# Patient Record
Sex: Female | Born: 1950 | Race: White | Hispanic: No | Marital: Married | Smoking: Never smoker
Health system: Southern US, Academic
[De-identification: ages and names within clinical notes are randomized; demographics above are authoritative.]

## PROBLEM LIST (undated history)

## (undated) DIAGNOSIS — I10 Essential (primary) hypertension: Secondary | ICD-10-CM

## (undated) DIAGNOSIS — G47419 Narcolepsy without cataplexy: Secondary | ICD-10-CM

## (undated) DIAGNOSIS — K219 Gastro-esophageal reflux disease without esophagitis: Secondary | ICD-10-CM

## (undated) DIAGNOSIS — A6920 Lyme disease, unspecified: Secondary | ICD-10-CM

## (undated) DIAGNOSIS — G2581 Restless legs syndrome: Secondary | ICD-10-CM

## (undated) HISTORY — PX: HX BUNIONECTOMY: SHX129

## (undated) HISTORY — PX: HX APPENDECTOMY: SHX54

## (undated) HISTORY — PX: HX HYSTERECTOMY: SHX81

## (undated) HISTORY — PX: CATARACT EXTRACTION, BILATERAL: SHX1313

## (undated) HISTORY — PX: HX BREAST BIOPSY: SHX20

## (undated) HISTORY — PX: HX HIP REPLACEMENT: SHX124

## (undated) HISTORY — PX: CARDIAC CATHETERIZATION: SHX172

---

## 1992-02-19 ENCOUNTER — Other Ambulatory Visit (HOSPITAL_COMMUNITY): Payer: Self-pay

## 2007-11-21 IMAGING — MG MAMMO SCREEN W CAD
1 series · 5 of 5 positions shown · non-contrast
Comparison: 08/15/2004 and 03/04/2003.

Encarnacion, Hipolito

BILATERAL DIGITAL SCREENING MAMMOGRAM WITH COMPUTER-ASSISTED DIAGNOSIS:
HISTORY: Asymptomatic 57-year old with no family history of breast cancer in first degree relatives.  The patient had a previous benign biopsy of the right breast.

[Series 2: R CC · right · 5 of 5 slices shown]
[im 1/5]
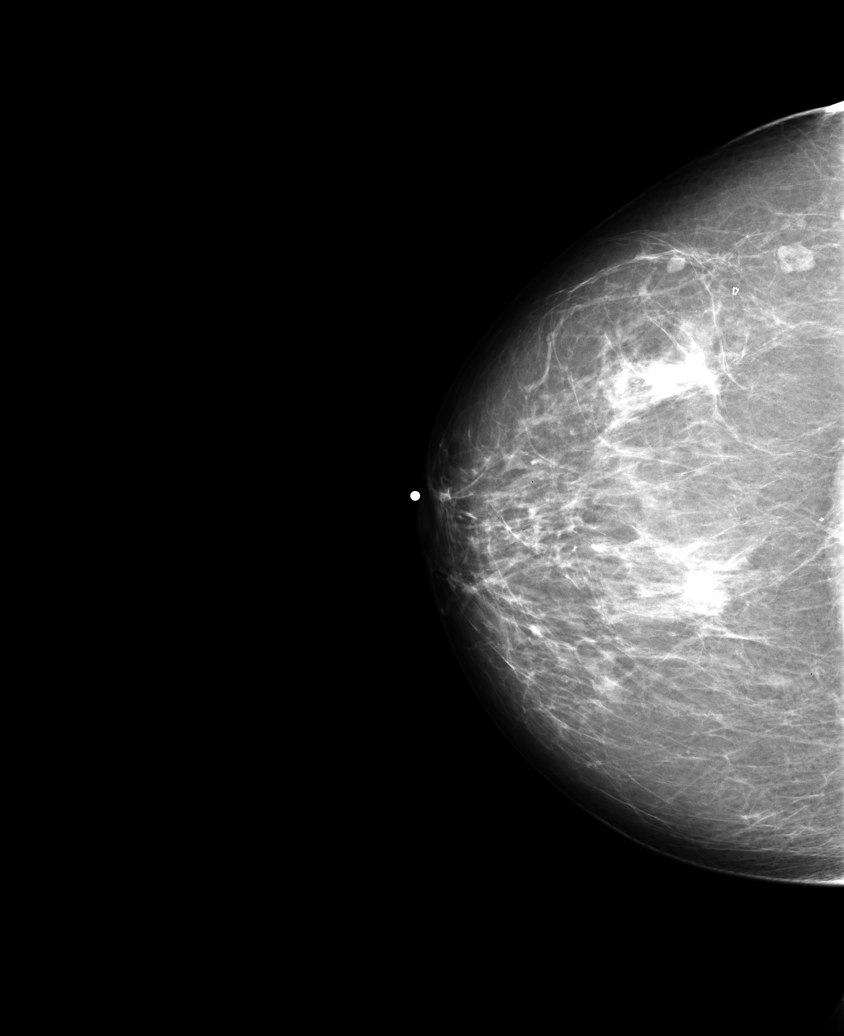
[im 2/5]
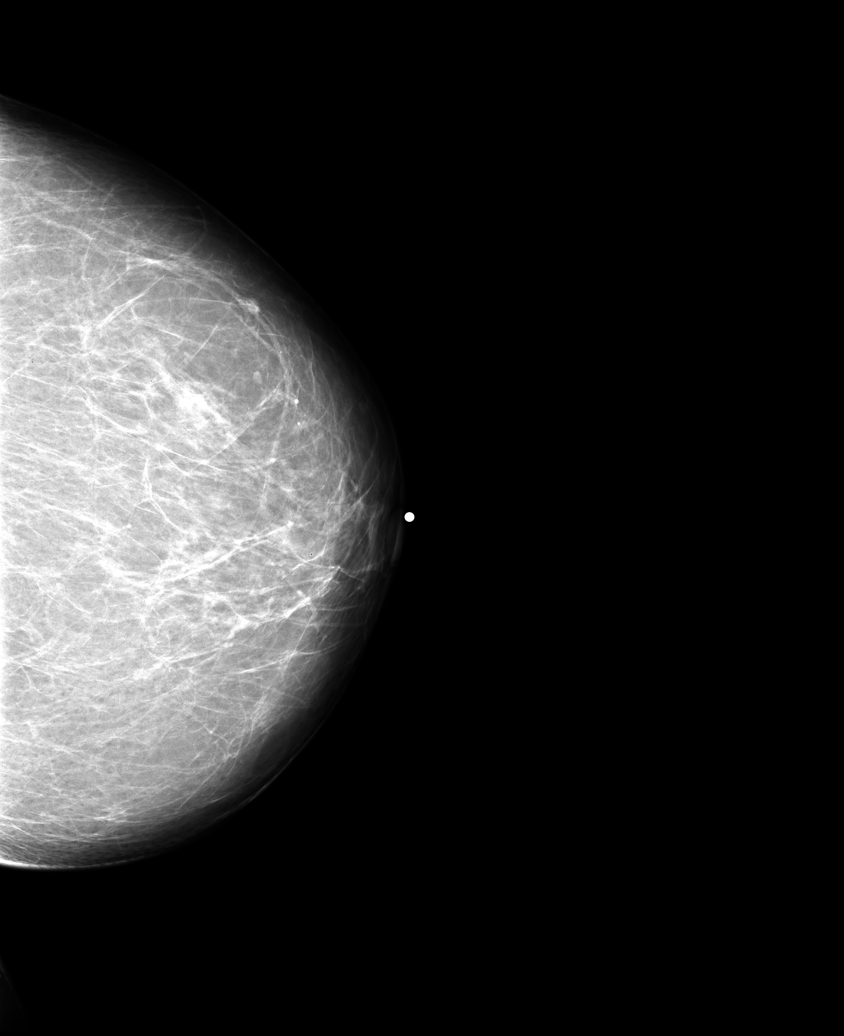
[im 3/5]
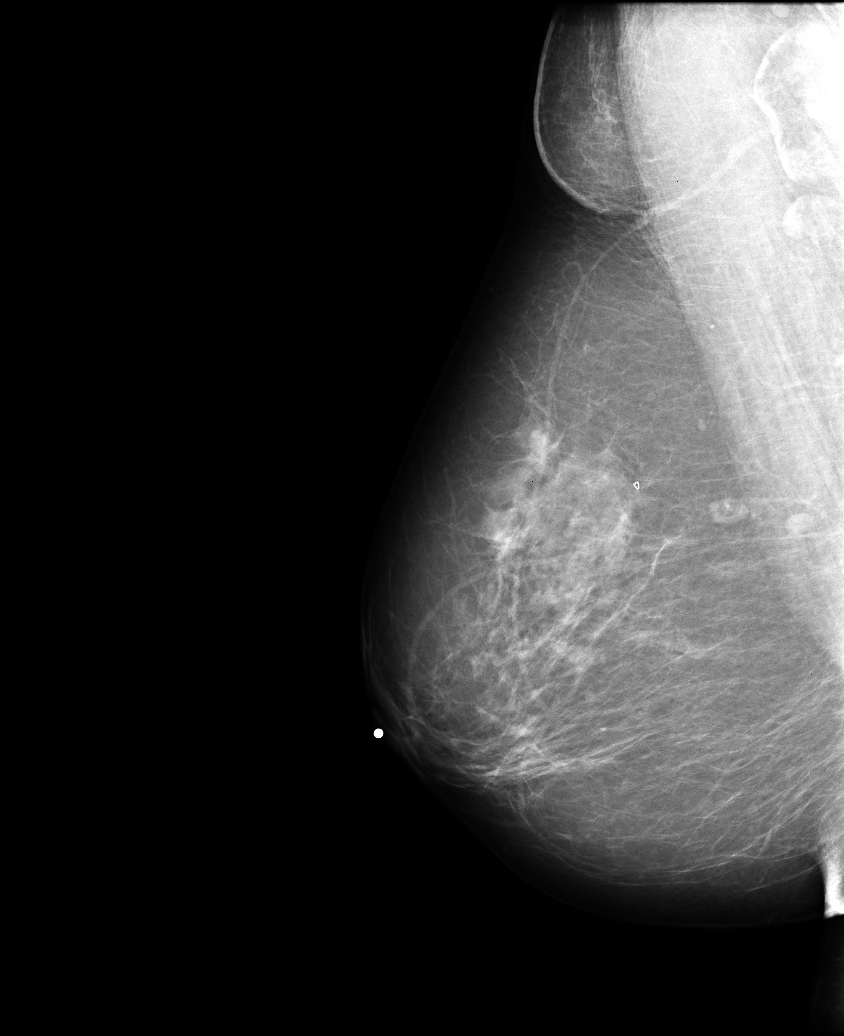
[im 4/5]
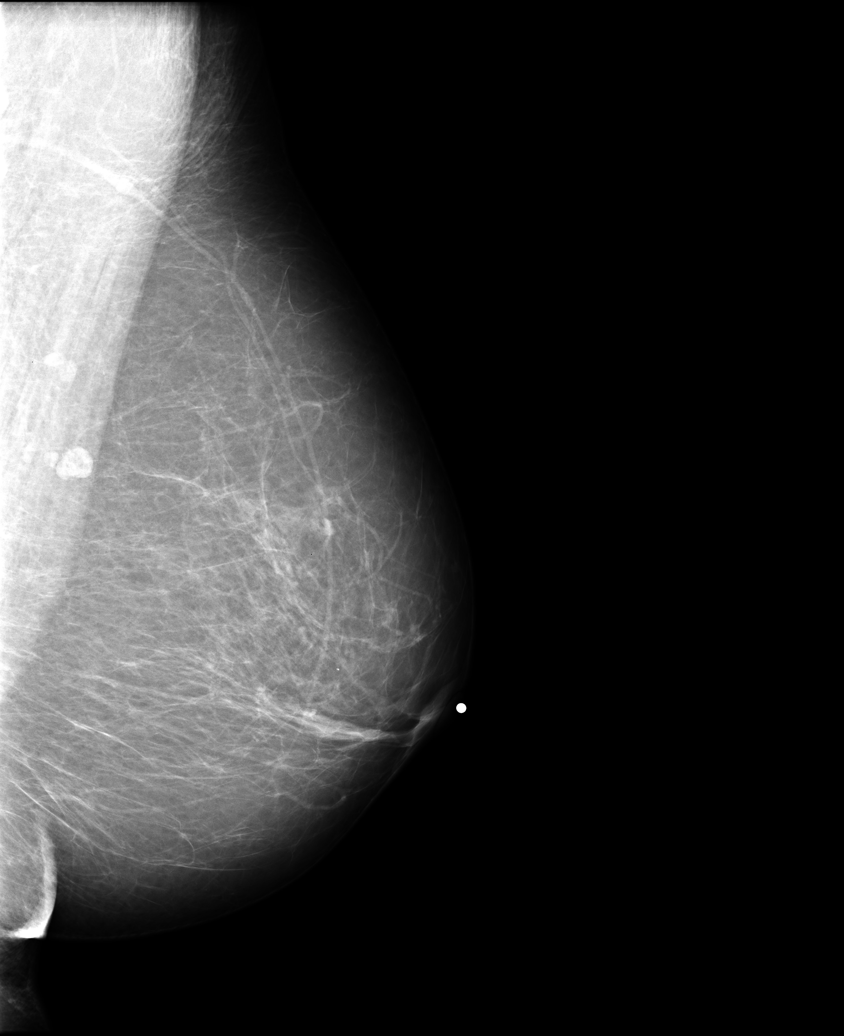
[im 5/5]
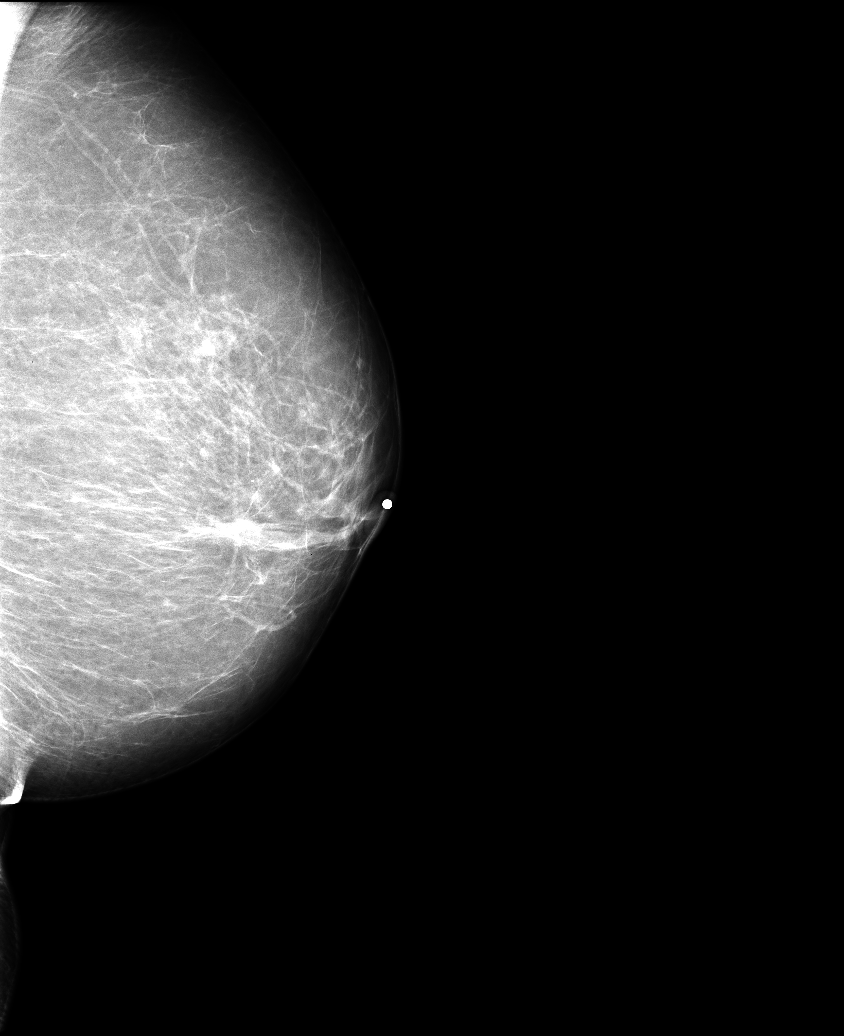

[5 of 5 positions shown; findings below may reference images not displayed]

FINDINGS: Breast tissues are predominately fatty.  Post-biopsy changes with a stereotactic biopsy clip in the upper lateral right breast are noted.  Small nodular densities in the posterior aspect of the right breast are also stable.  No calcific densities, skin changes or nipple changes are seen.

NOTE:

In compliance with Federal regulations, the results of this mammogram are being sent to the patient.
IMPRESSION: Stable mammographic findings including asymmetric breast tissues in the right breast and post-biopsy changes of the right breast and intramammary lymph nodes in the right breast.  Clinical followup and mammographic followup at 12 months are recommended.

BI-RADS 2:

BI-RADS 0
Need additional imaging evaluation
BI-RADS 1
Negative mammogram
BI-RADS 2
Benign finding
BI-RADS 3
Probably benign finding - short interval follow-up suggested
BI-RADS 4
Suspicious abnormality: biopsy should be considered
BI-RADS 5
Highly suggestive of malignancy; appropriate action should be taken
________________________________

## 2012-03-18 IMAGING — MG MAMMO BILAT DIAGNOSTIC W CAD
1 series · 5 of 5 positions shown · non-contrast
Comparison: 

Bosch, Kenia

Jumper, Nya
Exam:
Bilateral digital diagnostic mammogram with CAD and high resolution ultrasound of both breasts
HISTORY: 61 year old female complains of bilateral breast tenderness. Patient had previous benign biopsy of the right breast. There is no family history of breast cancer in first degree relatives.

[Series 2: R CC · right · 5 of 5 slices shown]
[im 1/5]
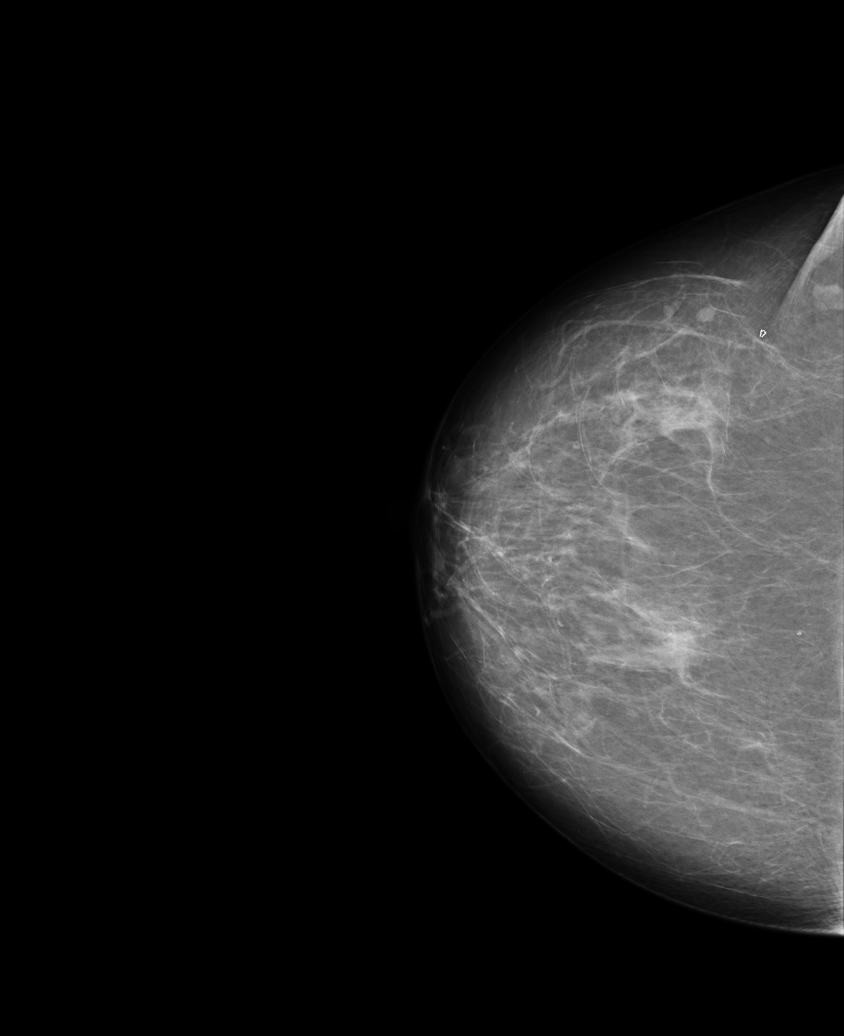
[im 2/5]
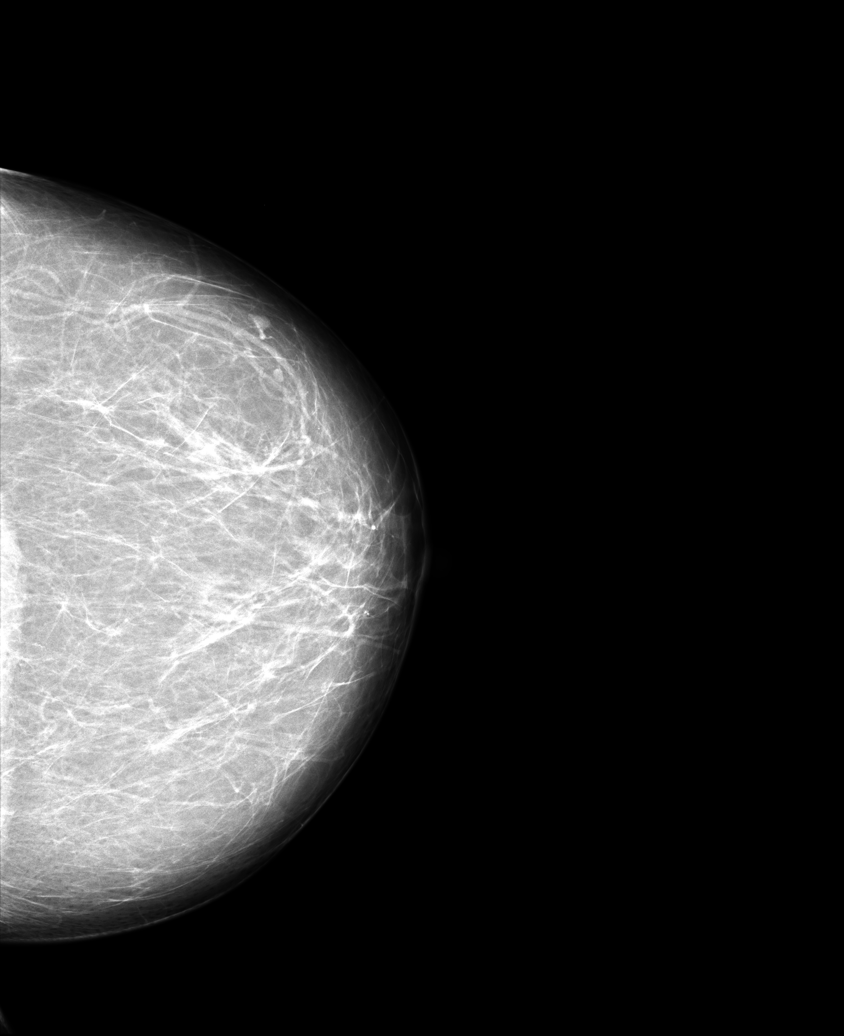
[im 3/5]
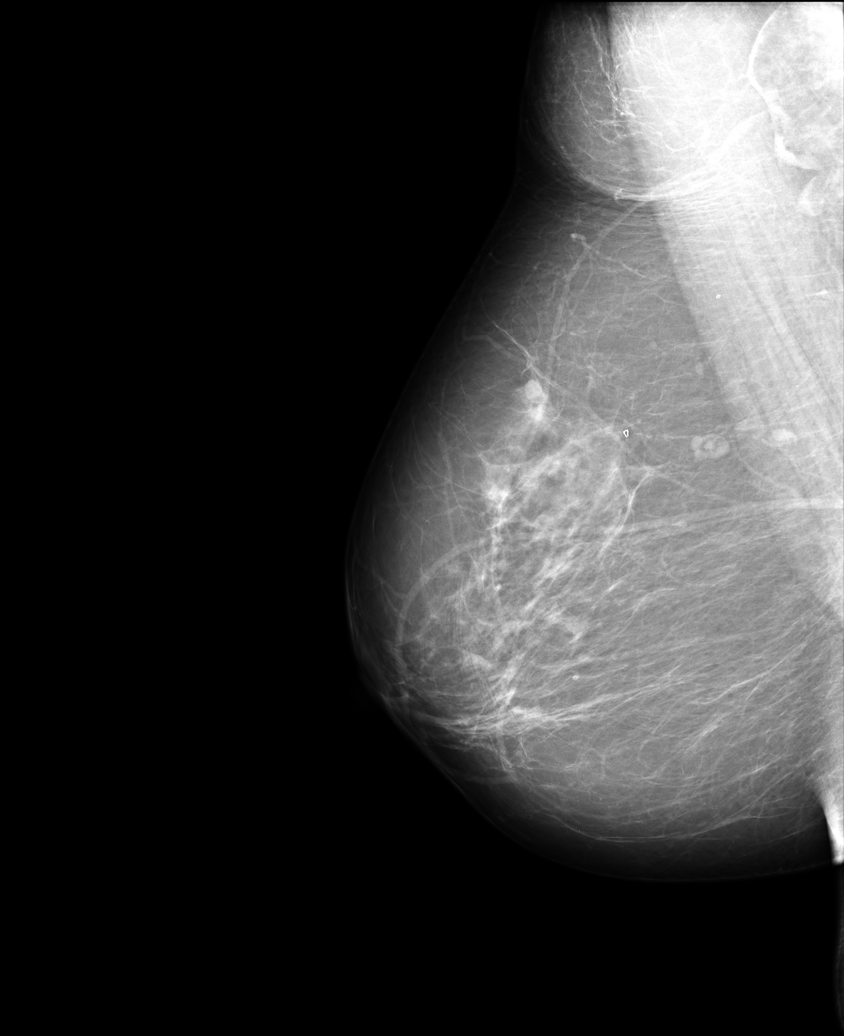
[im 4/5]
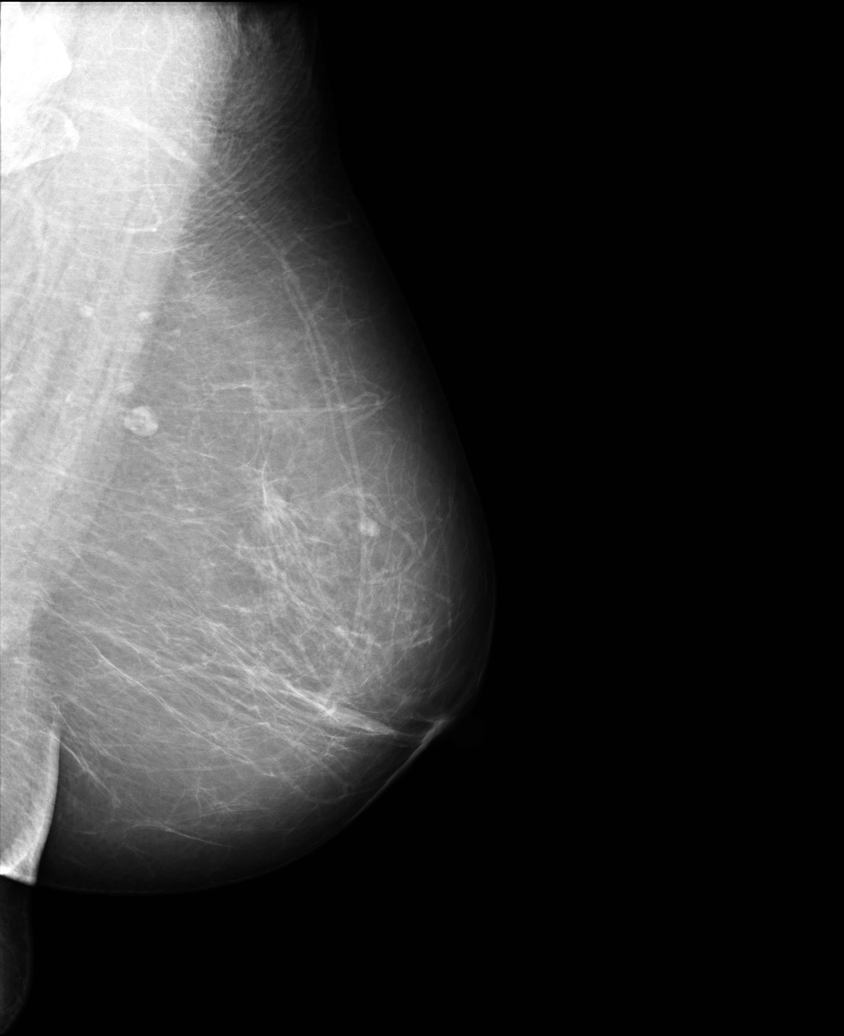
[im 5/5]
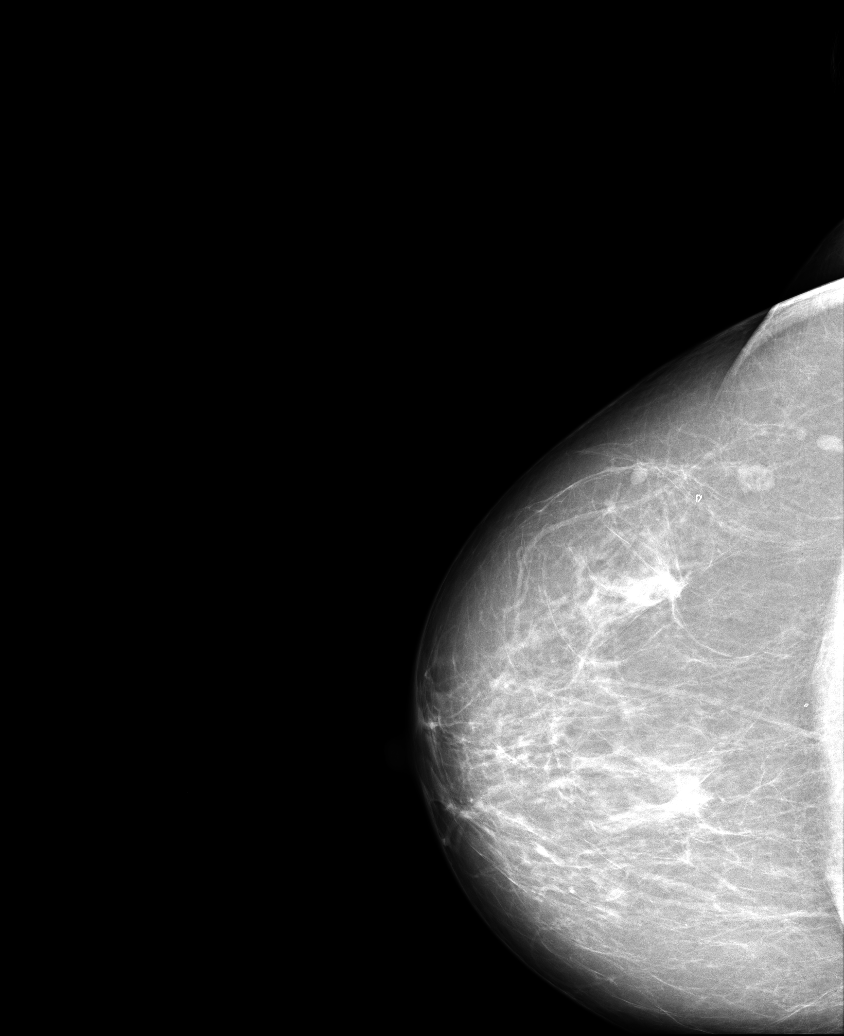

[5 of 5 positions shown; findings below may reference images not displayed]

FINDINGS: Breast tissues are mostly fatty. Biopsy changes of the lateral right breast are stable. Small benign appearing nodules of both breasts are also stable. No abnormal calcific density, skin changes or nipple changes are seen. 

High resolution ultrasound of both breasts reveals no cystic mass, solid mass or architectural change. Benign appearing intramammary lymph node is noted on the left side at 2 o’clock position, zone 3 with fatty hilum suggestive of benign intramammary node less than 1 cm in size.
IMPRESSION: Benign and stable findings on mammogram. 
Benign findings of both breasts on ultrasound including a benign appearing intramammary lymph node at 2 o’clock position of the left breast. 
RECOMMENDATION: Repeat bilateral breast ultrasound is recommended in six months along with clinical follow up in six months. 
Final Assessment Code:
Bi-Rads 3
BI-RADS 0
Need additional imaging evaluation
BI-RADS 1
Negative mammogram
BI-RADS 2
Benign finding
BI-RADS 3
Probably benign finding: short-interval follow-up suggested
BI-RADS 4
Suspicious abnormality:  biopsy should be considered
BI-RADS 5
Highly suggestive of malignancy; appropriate action should be taken

NOTE:
In compliance with Federal regulations, the results of this mammogram are being sent to the patient.

________________________________
Izko Ciise., signed this document electronically

## 2012-06-15 IMAGING — CT HEAD^HEAD_SPIRAL_CONTRAST_SAFECT (ADULT)
1 of 2 series · 14 of 30 positions shown, 18 images · non-contrast
Comparison: None available.

Ausfluge, Belo Selma
CT Head w/contrast BANKS

Abd L Raheem, Ahmed Ramazan Hussain
bandhu Tiger, Dleke
Exam:
CT Head with and without contrast, low dose Safe CT protocol
HISTORY: 61 year old female with history of severe headache, visual disturbance and nausea.
TECHNIQUE: Axial images were obtained before and after intravenous administration of 85 cc’s of Optiray 300. Images were reviewed in multiple windows. Total radiation dose length product equals 6811 mGy cm.

[Series 905: head w (safect) · axial · 0.49mm/px · z∈[-37,+98]mm · 14 of 53 slices shown, 18 images]
[im 4/53  brain]
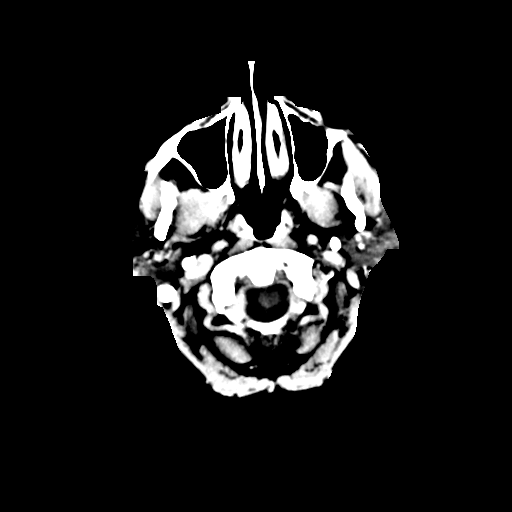
[im 4/53  bone]
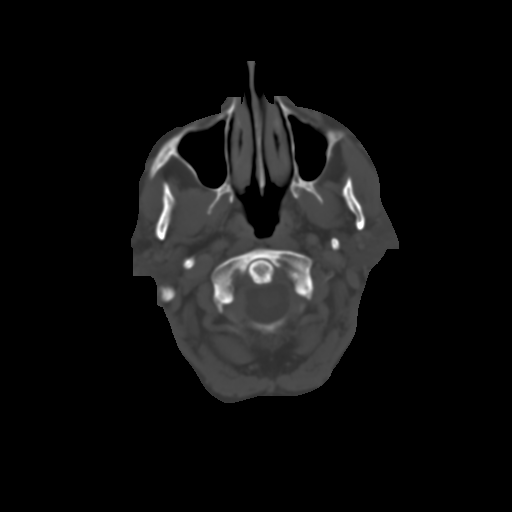
[im 7/53  brain]
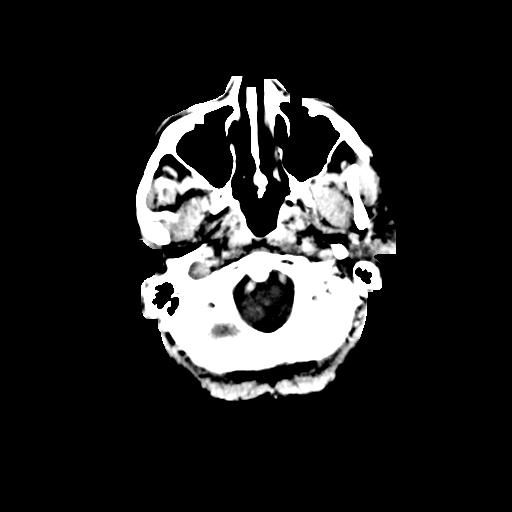
[im 11/53  brain]
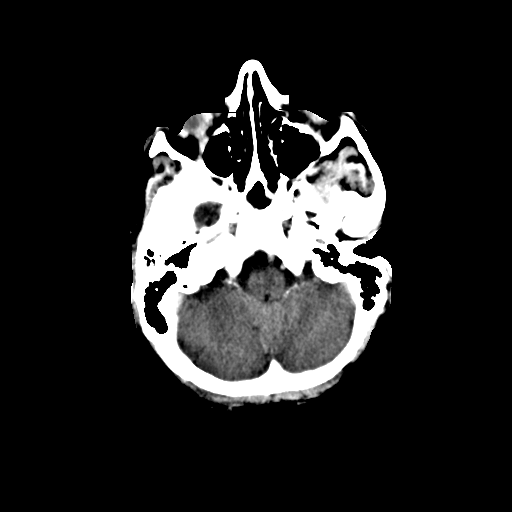
[im 14/53  brain]
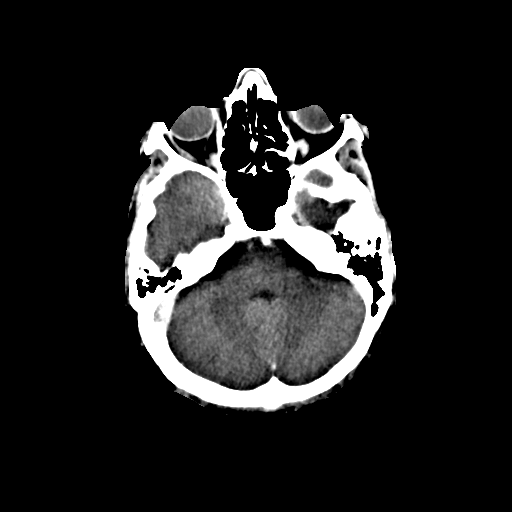
[im 18/53  brain]
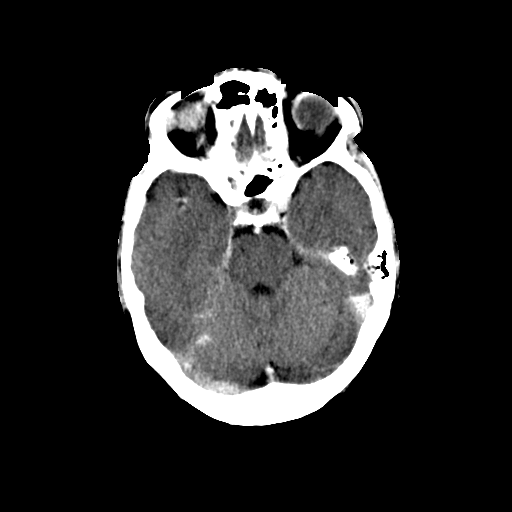
[im 18/53  bone]
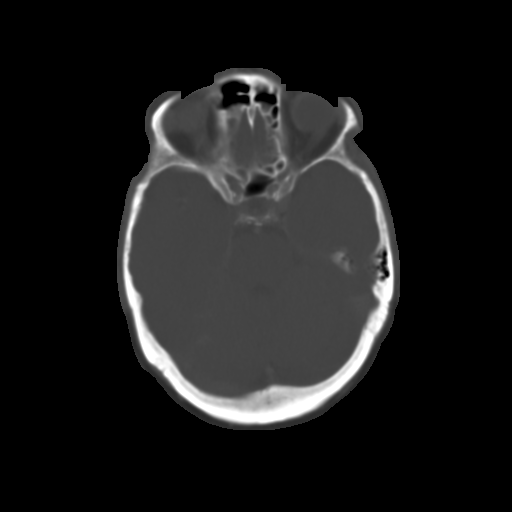
[im 21/53  brain]
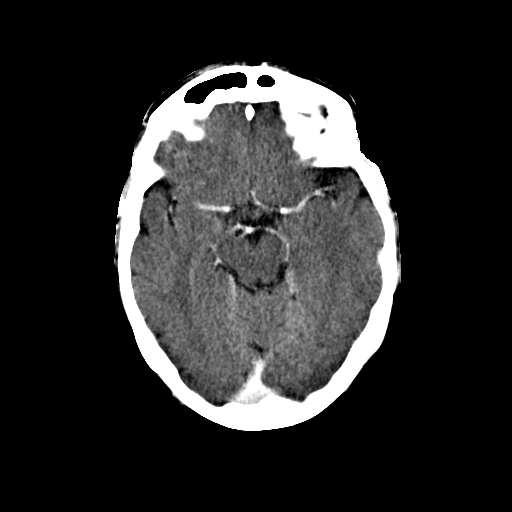
[im 25/53  brain]
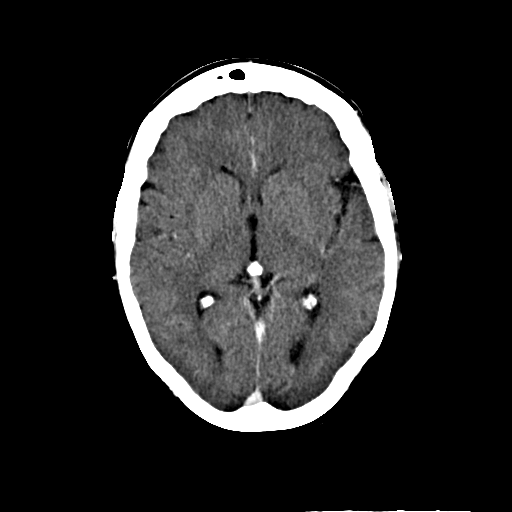
[im 28/53  brain]
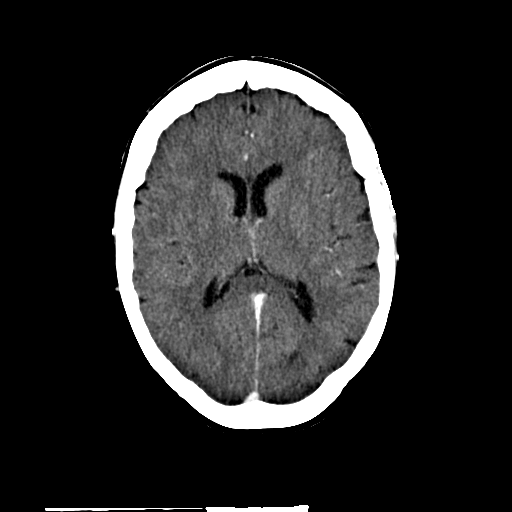
[im 32/53  brain]
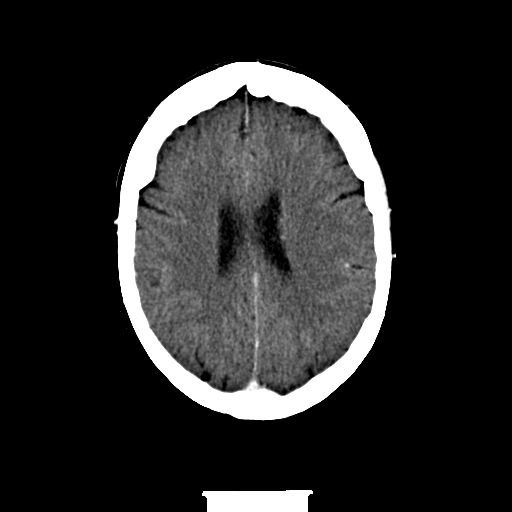
[im 32/53  bone]
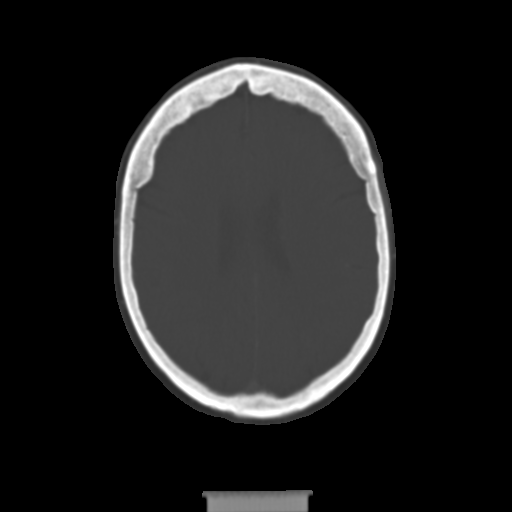
[im 35/53  brain]
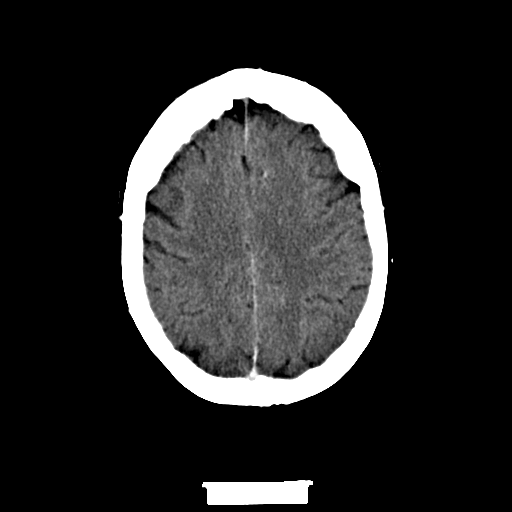
[im 39/53  brain]
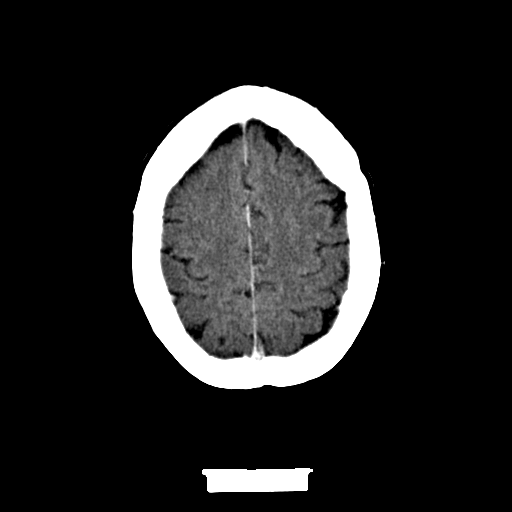
[im 42/53  brain]
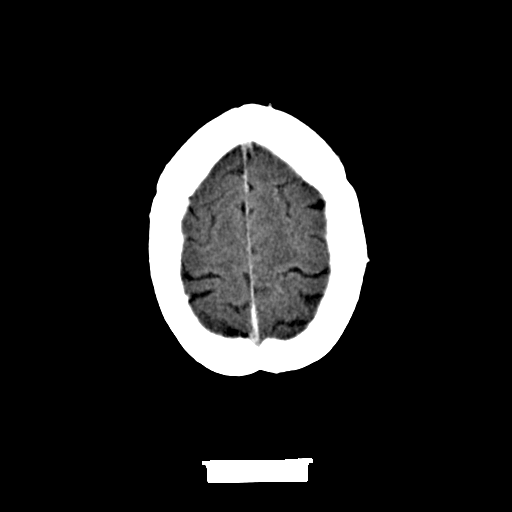
[im 46/53  brain]
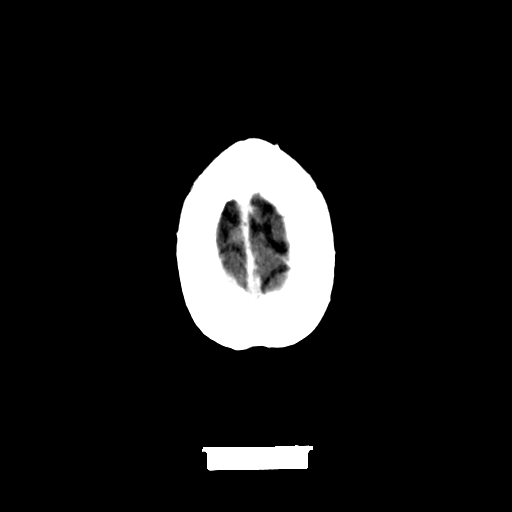
[im 46/53  bone]
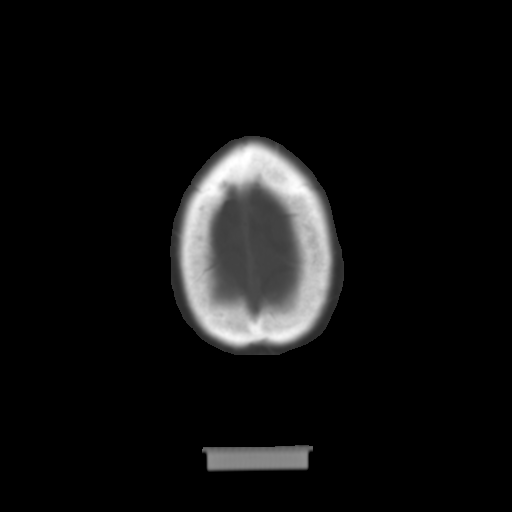
[im 49/53  brain]
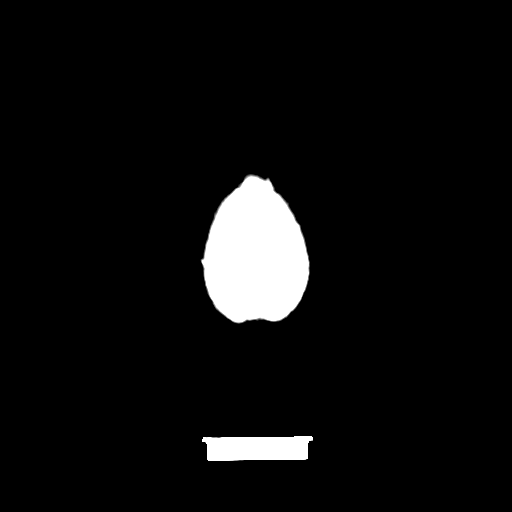

[14 of 30 positions shown; findings below may reference images not displayed]

FINDINGS: No intracranial space occupying lesions, ventriculomegaly or midline shift is seen. Arteries of Circle of Willis and dural venous sinuses are patent. 

No bone changes or sinus abnormalities are seen.
IMPRESSION: No intracranial space occupying lesion, ventriculomegaly or midline shift is seen. 
No abnormal enhancement is seen after the administration of contrast. 
Arteries of Circle of Willis and dural venous sinuses are patent. 
Sinuses and mastoids are clear. 

________________________________

## 2012-06-15 IMAGING — CT THORAX^CHEST_WITH_HIGHRES_SAFECT (ADULT)
2 series · 16 of 31 positions shown, 19 images · IV contrast (agent unspecified)
Comparison: CT angiogram of the chest from [facility] dated 01/07/09 and chest x-ray dated 03/18/12.

Mahdavi, Icela
CT Thorax with contrast SUA

Kale, Aissata
bandhu Sacan, Yuviny
Exam:
CT Chest with contrast, including multiplanar construction, low dose Safe CT protocol
HISTORY: 61 year old female non smoker was found to have a 4 mm size nodule in the right upper lobe on a CT scan done in 2727. This is for a follow up evaluation. There is no prior history of malignancy in this patient. Prior surgeries include hysterectomy and hip surgery.
TECHNIQUE: 64 slice low dose Safe CT protocol was used after intravenous administration of 85 cc’s of Optiray 300. The images were reviewed in multiple windows and projections. Total radiation dose length product equals 185 mGy cm.

[Series 5: hi res 1 mm 10 increment · axial · 0.81mm/px · z∈[-364,-154]mm · 9 of 27 slices shown, 12 images]
[im 3/27  mediastinal]
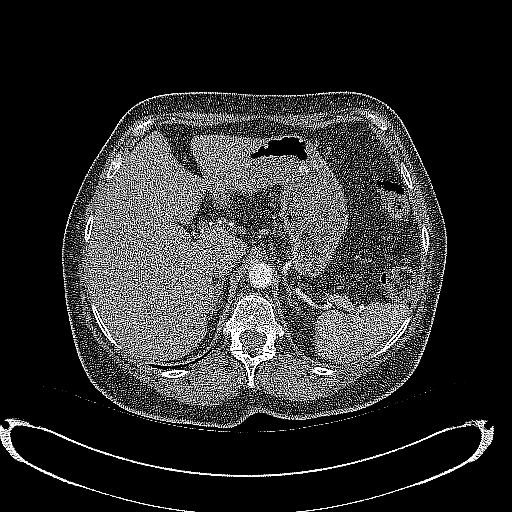
[im 3/27  lung]
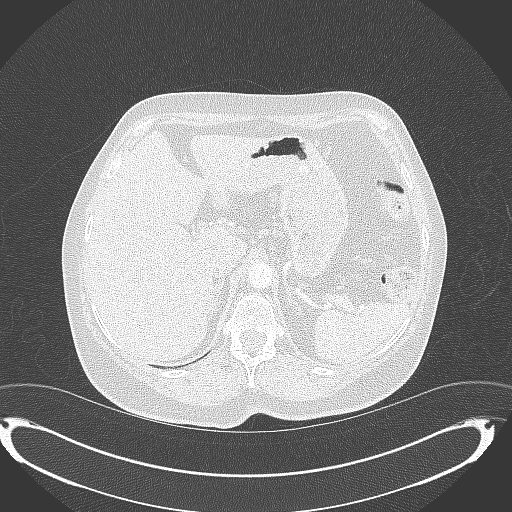
[im 6/27  lung]
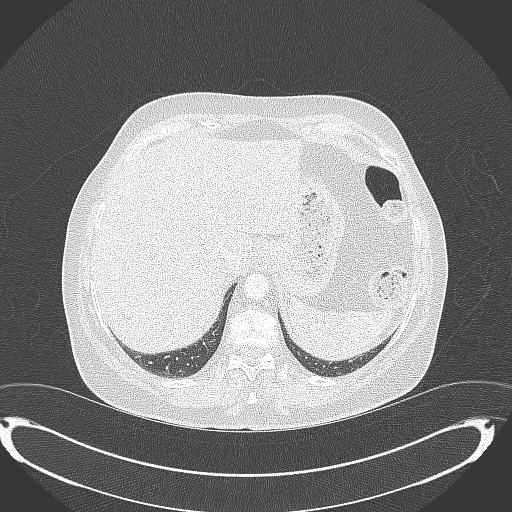
[im 9/27  lung]
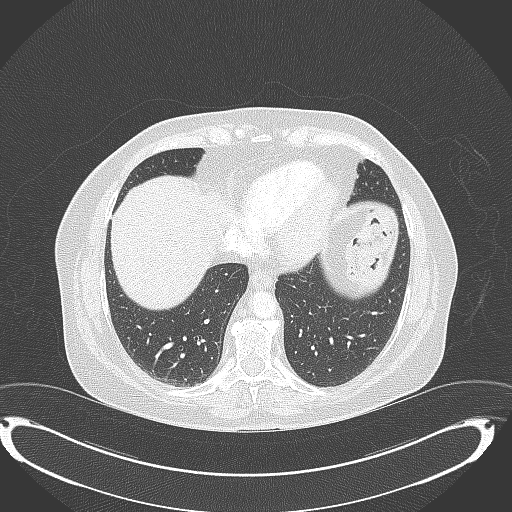
[im 12/27  lung]
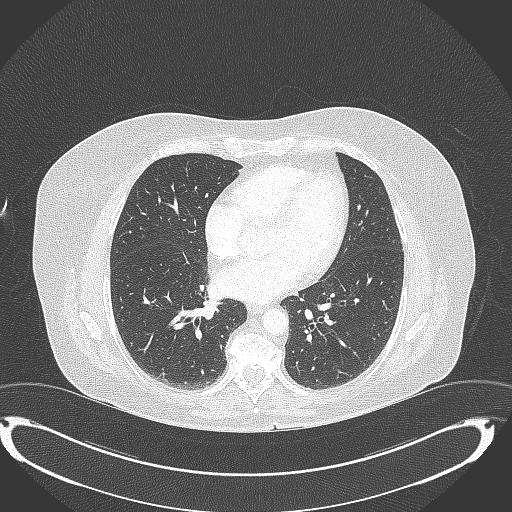
[im 13/27  mediastinal]
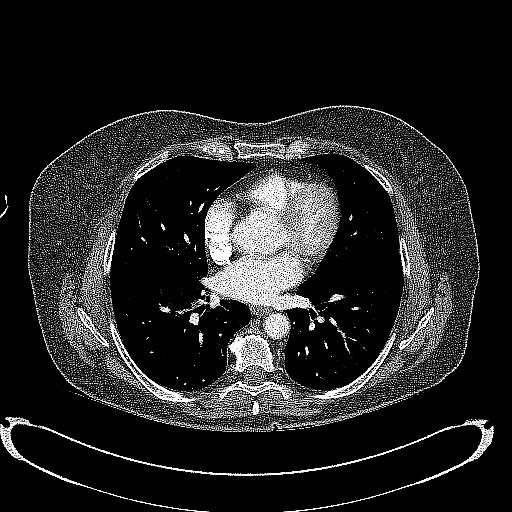
[im 13/27  lung]
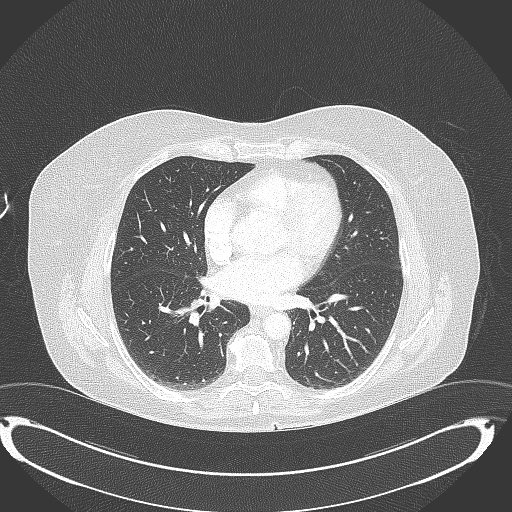
[im 15/27  lung]
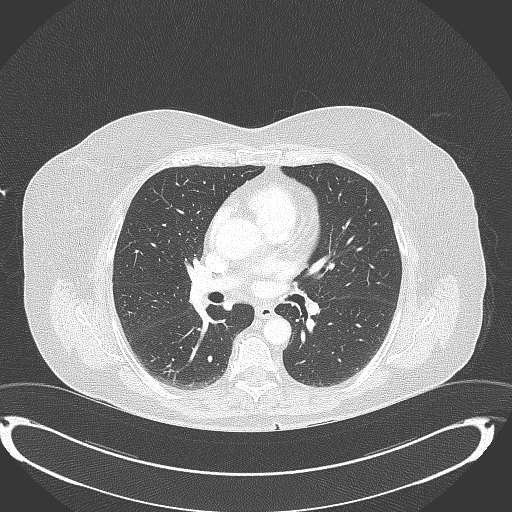
[im 18/27  lung]
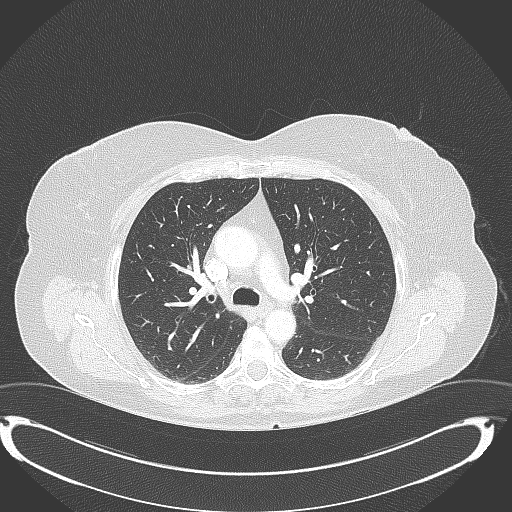
[im 21/27  lung]
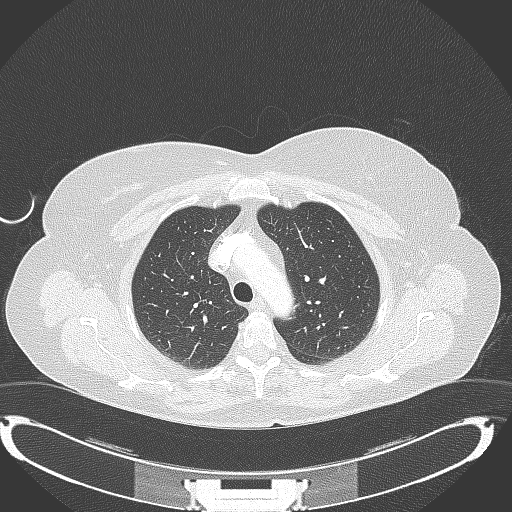
[im 24/27  mediastinal]
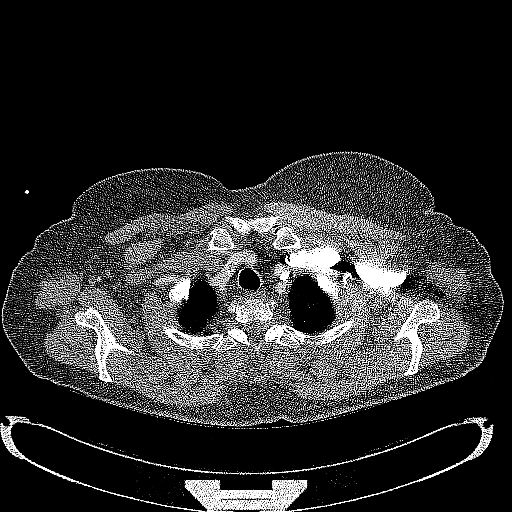
[im 24/27  lung]
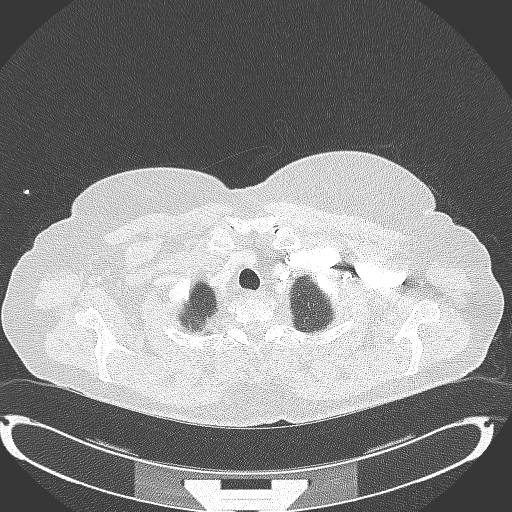

[Series 902: chest soft with (safect) · axial · 0.81mm/px · z∈[-353,-188]mm · 7 of 52 slices shown]
[im 7/52  mediastinal]
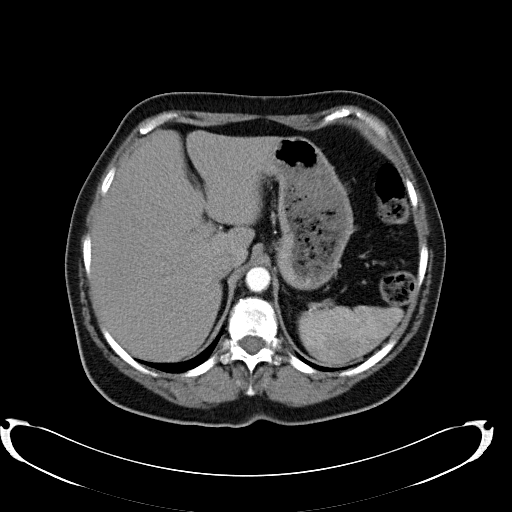
[im 13/52  mediastinal]
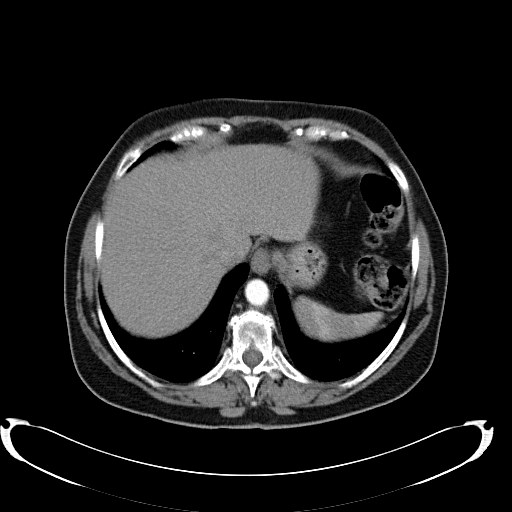
[im 18/52  mediastinal]
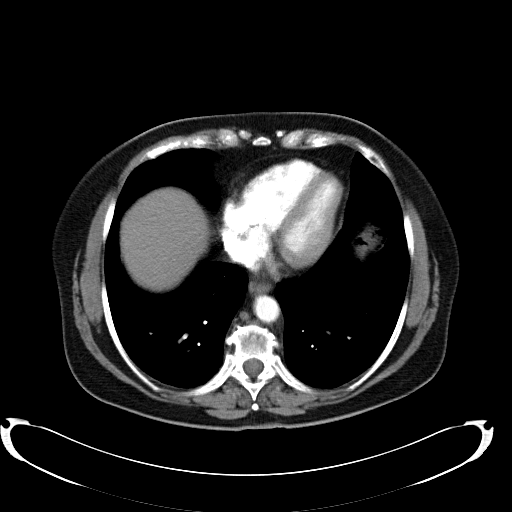
[im 22/52  mediastinal]
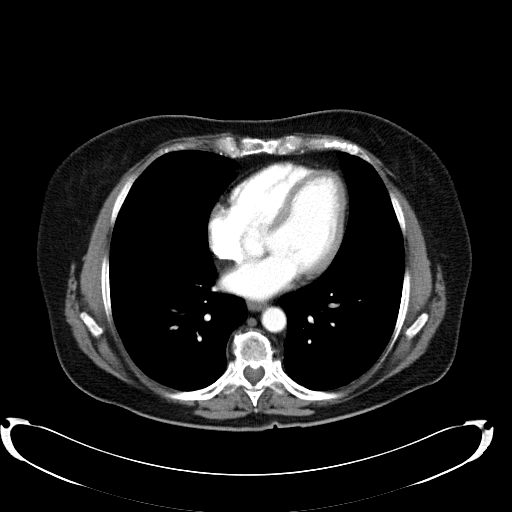
[im 31/52  mediastinal]
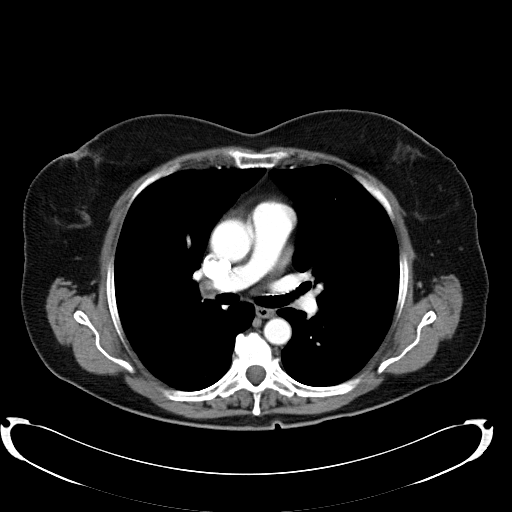
[im 35/52  mediastinal]
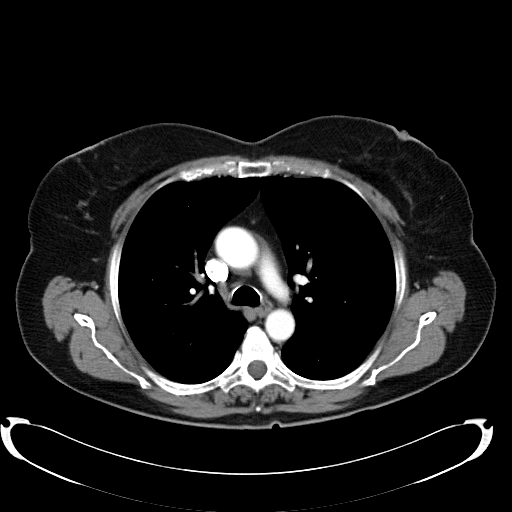
[im 40/52  mediastinal]
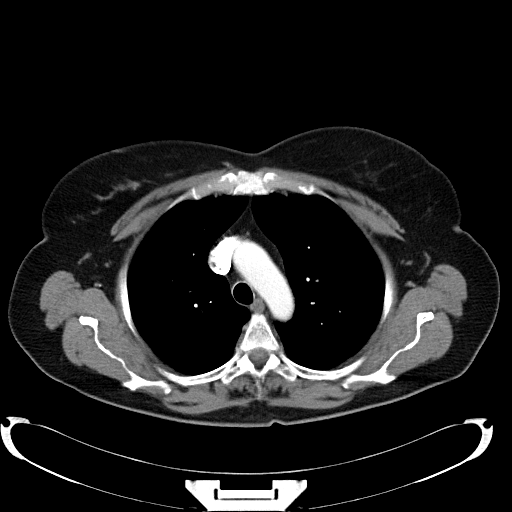

[16 of 31 positions shown; findings below may reference images not displayed]

FINDINGS: Small non-calcified nodule in the anterior aspect of the right upper lobe close to the minor fissure is completely stable in size measuring 3.9 mm in diameter. No additional or new nodules of the lungs are seen. No pleural effusion or pericardial effusion is seen. No hilar adenopathy or mediastinal lymphadenopathy is seen. 

No abnormalities of supraclavicular fossa, axilla or upper abdomen are seen.
IMPRESSION: Stable non-calcified benign nodule in the right upper lobe measuring 3.9 mm in diameter with no change seen from 01/07/09. This would require no further follow up. 
No lymphadenopathy or effusion is seen in the chest. 
No focal bone changes are seen. Degenerative disc disease of multiple thoracic discs is noted. 

________________________________

## 2018-10-29 IMAGING — MR MRI BRAIN WITHOUT AND WITH CONTRAST
10 of 12 series · 35 of 48 positions shown · IV contrast (gadavist)
Comparison: None available.

EXAM:  MRI BRAIN WITHOUT AND WITH CONTRAST
INDICATION: Headaches and dizziness.
TECHNIQUE: Multiplanar multisequential MRI of the brain and internal auditory canals was performed without and with 6 mL of Gadavist.

[Series 9: DWI · axial · 5.0mm · 1.35mm/px · z∈[-51,+75]mm · 9 of 88 slices shown (1 of 3)]
[im 1/88]
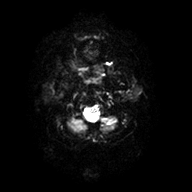
[im 16/88]
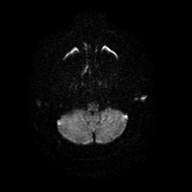
[im 24/88]
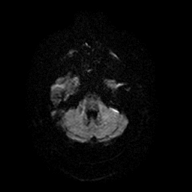
[im 40/88]
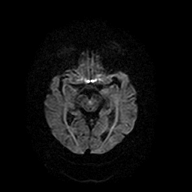
[im 48/88]
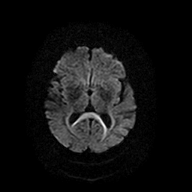
[im 64/88]
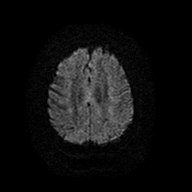
[im 72/88]
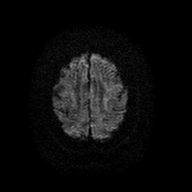
[im 80/88]
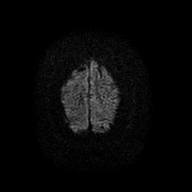
[im 88/88]
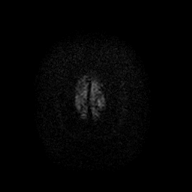

[Series 10: DWI · axial · 5.0mm · 1.35mm/px · z∈[-51,+75]mm · 4 of 22 slices shown (2 of 3)]
[im 1/22]
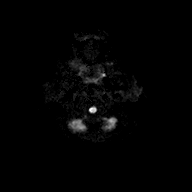
[im 8/22]
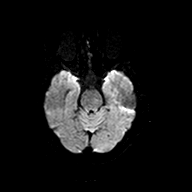
[im 15/22]
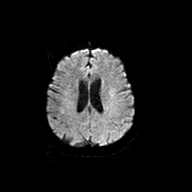
[im 22/22]
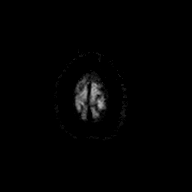

[Series 11: DWI · axial · 5.0mm · 1.35mm/px · z∈[-51,+75]mm · 3 of 22 slices shown (3 of 3)]
[im 1/22]
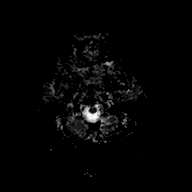
[im 11/22]
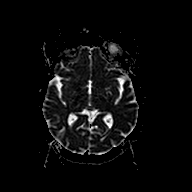
[im 22/22]
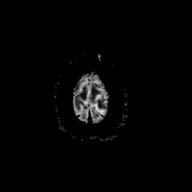

[Series 13: FLAIR · sagittal · 4.0mm · 0.75mm/px · 3 of 26 slices shown (1 of 2)]
[im 1/26]
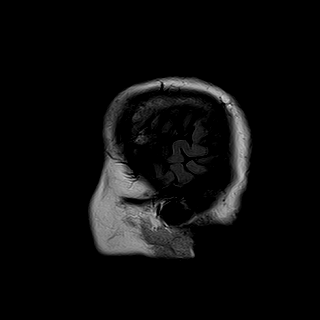
[im 13/26]
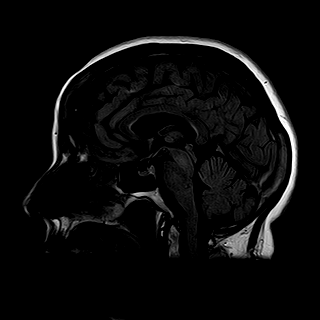
[im 26/26]
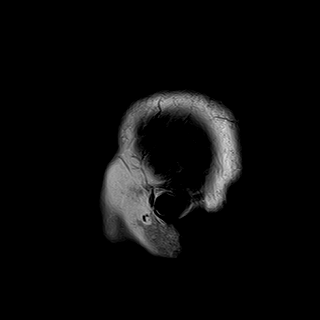

[Series 15: T2 · axial · 4.0mm · 0.43mm/px · z∈[-52,+88]mm · 4 of 36 slices shown]
[im 1/36]
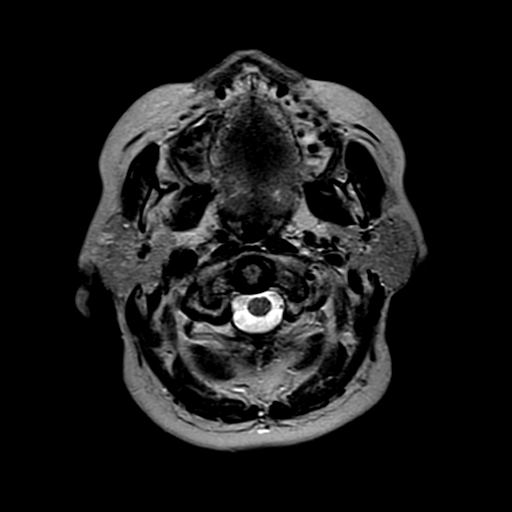
[im 12/36]
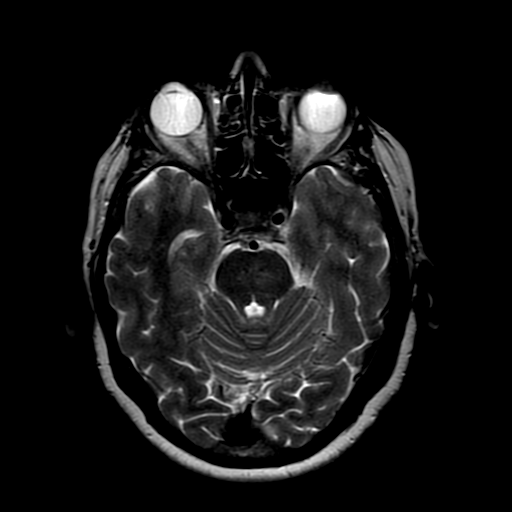
[im 24/36]
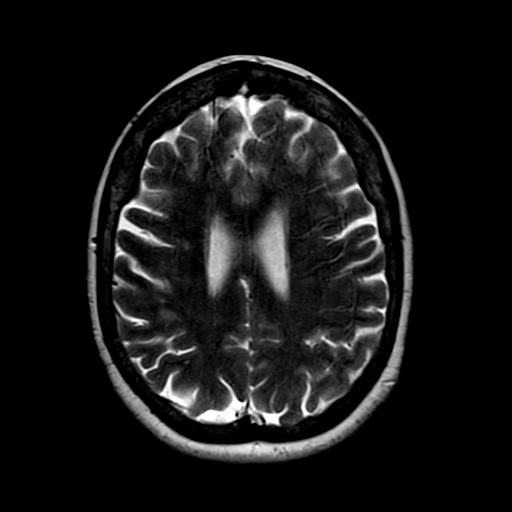
[im 36/36]
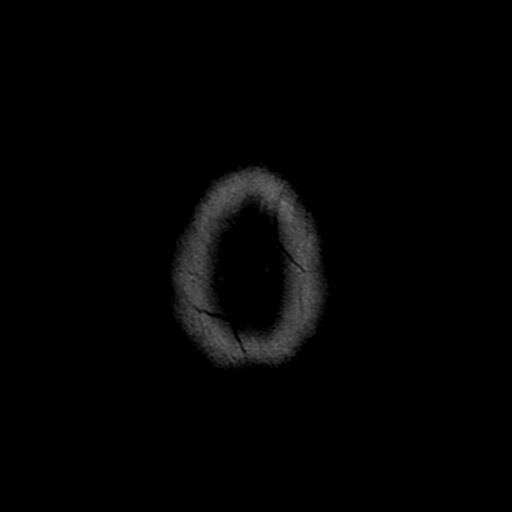

[Series 16: FLAIR · axial · 4.0mm · 0.43mm/px · z∈[-52,+88]mm · 4 of 36 slices shown (2 of 2)]
[im 1/36]
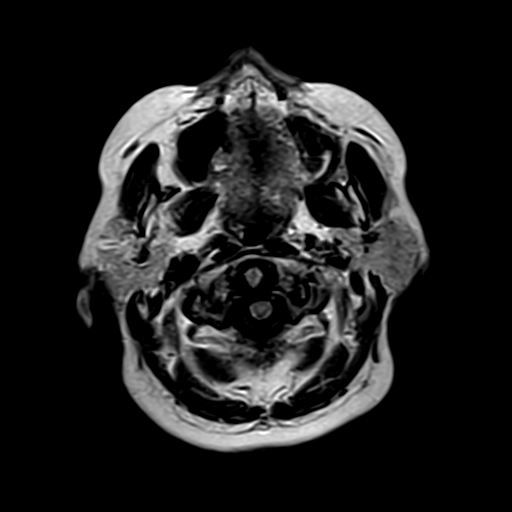
[im 12/36]
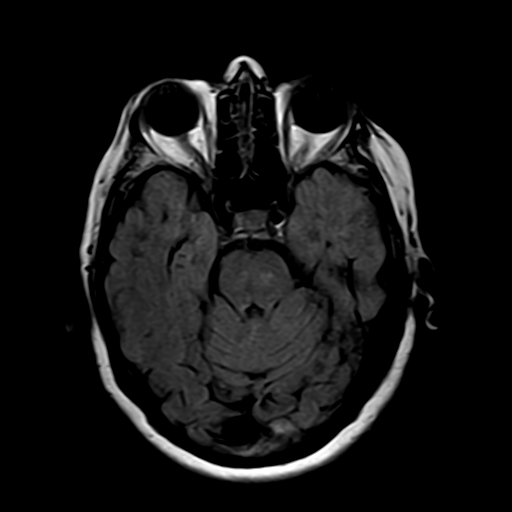
[im 24/36]
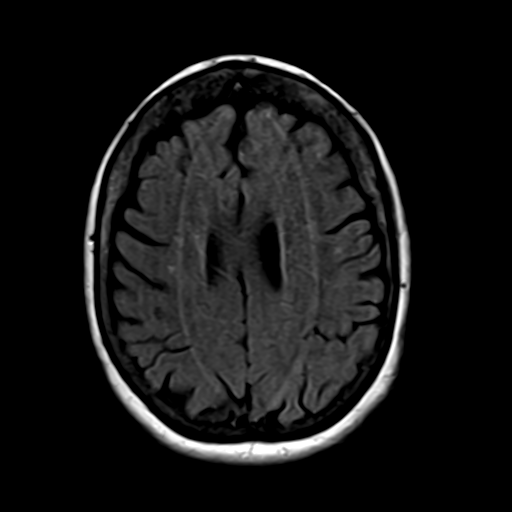
[im 36/36]
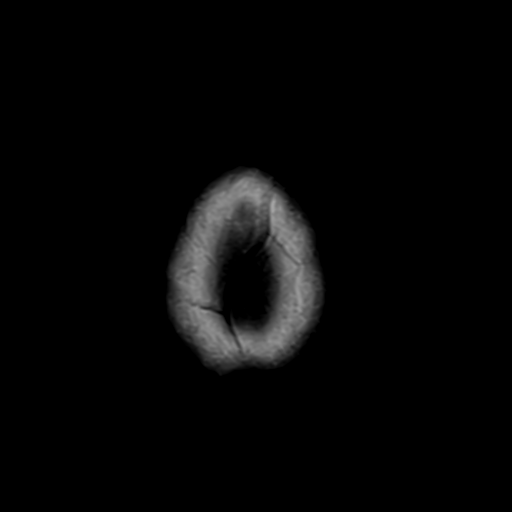

[Series 17: T1 · axial · 4.0mm · 0.43mm/px · z∈[-52,-8]mm · 2 of 36 slices shown]
[im 1/36]
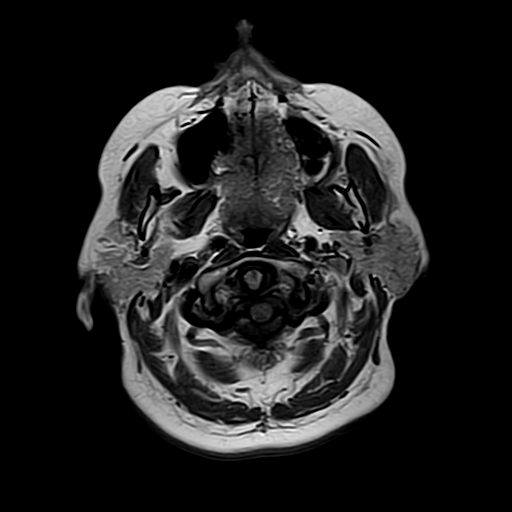
[im 12/36]
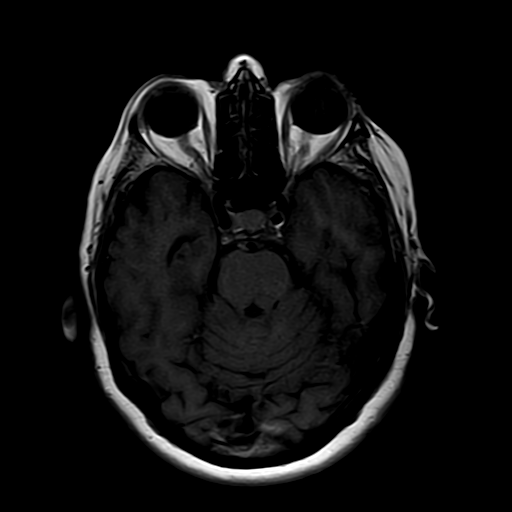

[Series 20: T1 post-contrast · axial · 3.0mm · 0.47mm/px · 1 of 11 slices shown (1 of 3)]
[im 1/11]
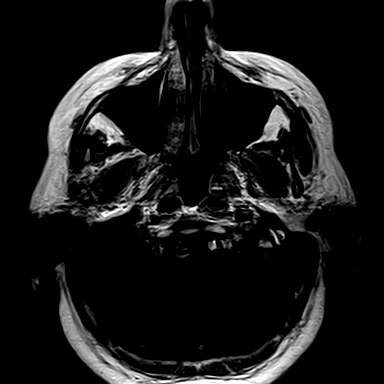

[Series 21: T1 post-contrast · axial · 4.0mm · 0.43mm/px · z∈[-52,+88]mm · 4 of 36 slices shown (2 of 3)]
[im 1/36]
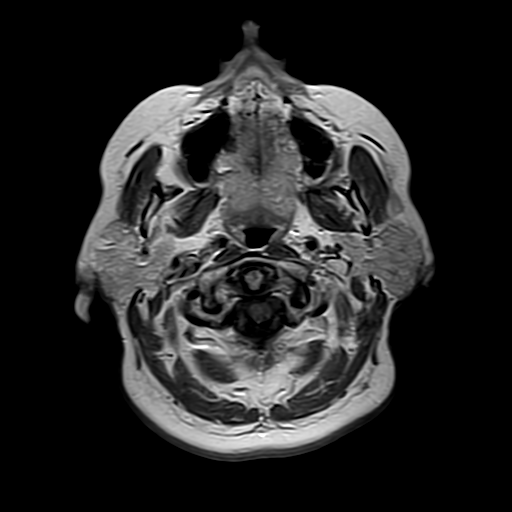
[im 12/36]
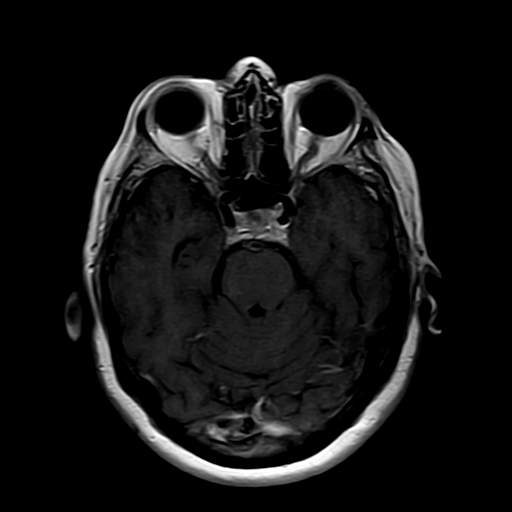
[im 24/36]
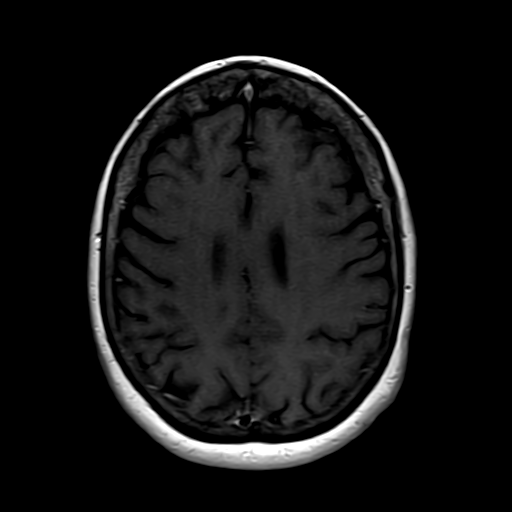
[im 36/36]
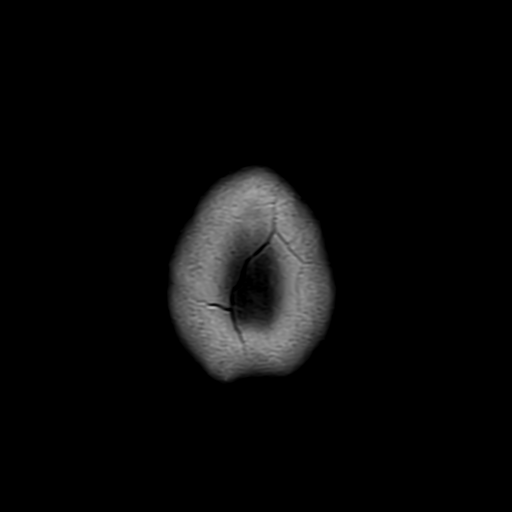

[Series 22: T1 post-contrast · coronal · 3.0mm · 0.47mm/px · 1 of 11 slices shown (3 of 3)]
[im 1/11]
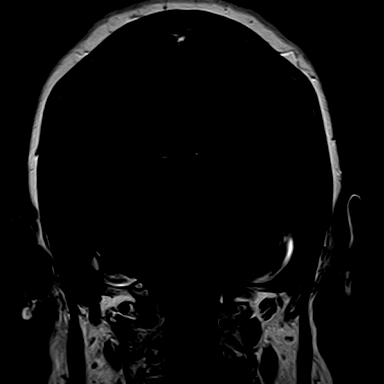

[35 of 48 positions shown; findings below may reference images not displayed]

FINDINGS: Examination is somewhat degraded due to excessive patient motion. Ventricular and sulcal size is normal for the patient's age. There are mild chronic small vessel ischemic changes. There is no mass effect, midline shift or intracranial hemorrhage. There is no evidence of acute infarction. Skull base flow voids and basal cisterns are patent. There is no cerebellopontine angle mass. Normal T2 signal intensity is noted within the cochlea, vestibule and semicircular canals bilaterally. Sagittal survey of midline structures is unremarkable. 

Following intravenous contrast administration, there is no abnormal parenchymal or leptomeningeal enhancement. No abnormal enhancement of the internal auditory canals is seen. There are no extra-axial fluid collections. Visualized paranasal sinuses, mastoid air cells and orbital contents are unremarkable.
IMPRESSION: 1. Mild chronic small vessel ischemic changes, no acute intracranial abnormality. 

 2. No abnormal parenchymal or leptomeningeal enhancement. 

3. Unremarkable internal auditory canals.

## 2018-12-01 IMAGING — US US ABDOMEN COMPLETE
1 series · 14 of 25 positions shown · non-contrast
Comparison: None available.

EXAM:  US ABDOMEN COMPLETE
INDICATION: Right upper quadrant pain with nausea and diarrhea.

[Series 2: us abdomen complete · 0.35mm/px · 14 of 46 slices shown]
[im 1/46]
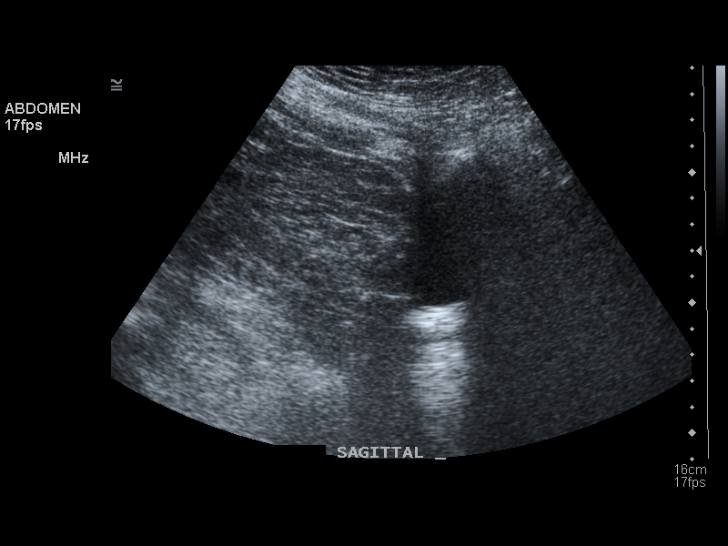
[im 4/46]
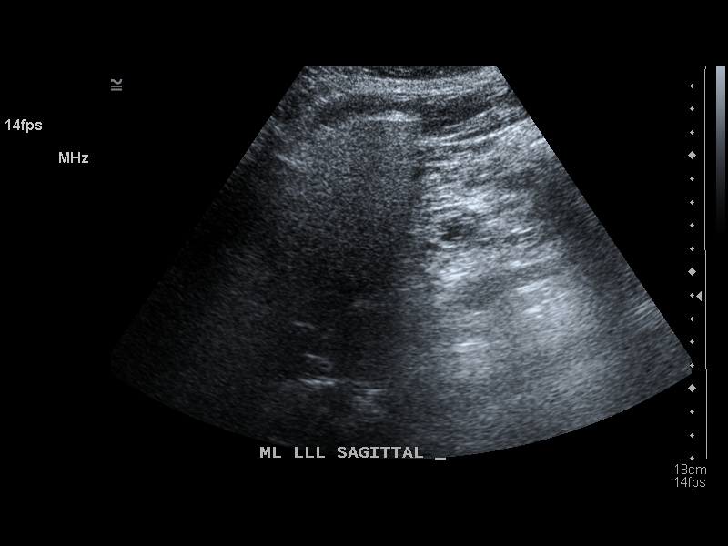
[im 8/46]
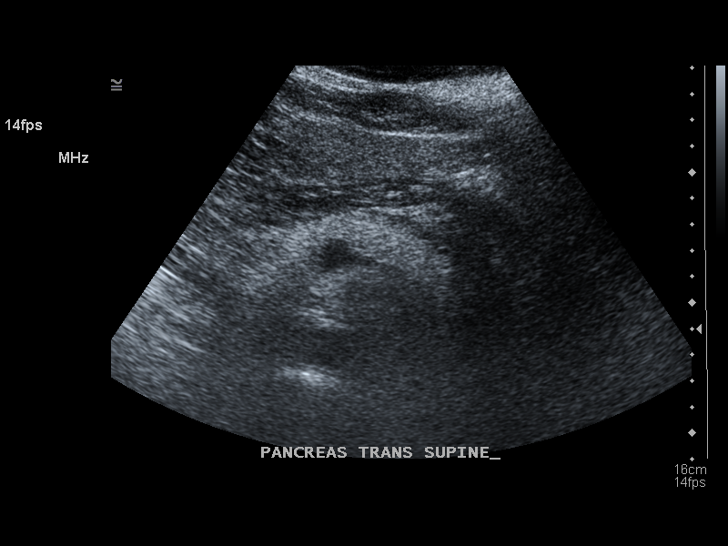
[im 12/46]
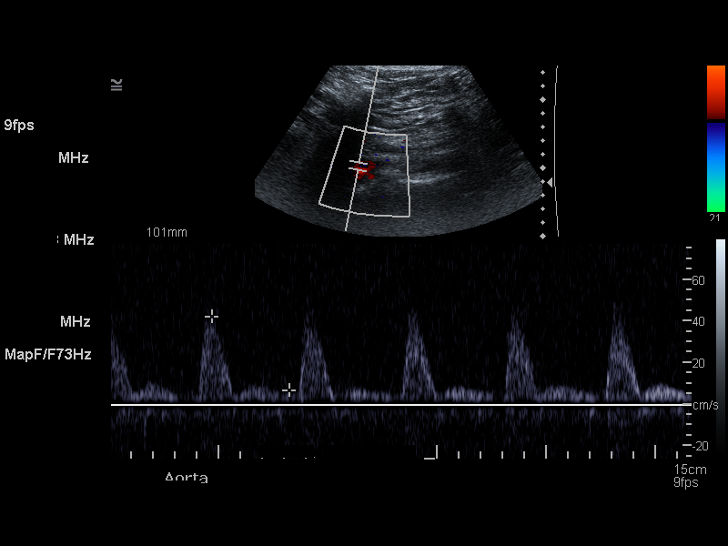
[im 16/46]
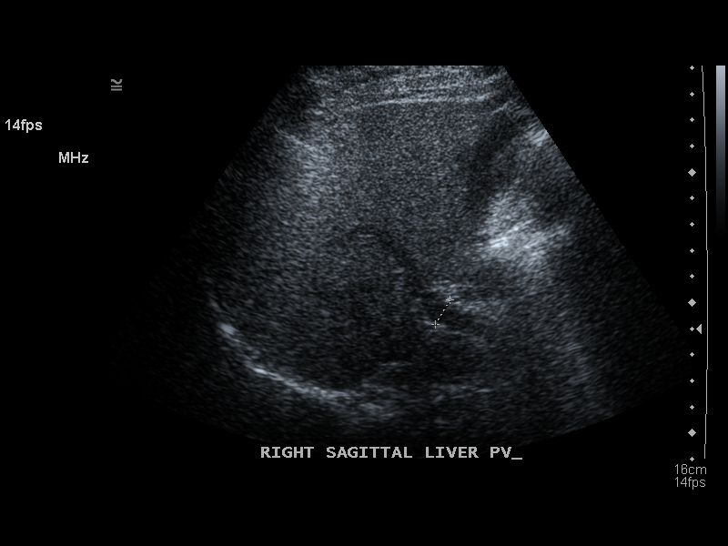
[im 17/46]
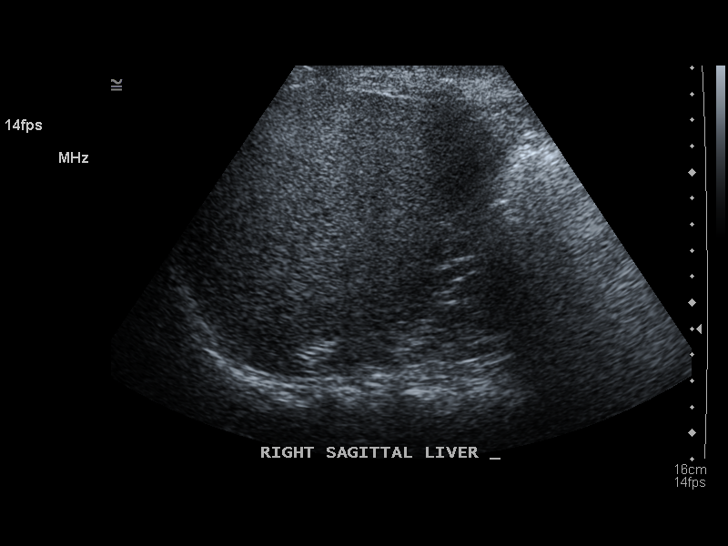
[im 21/46]
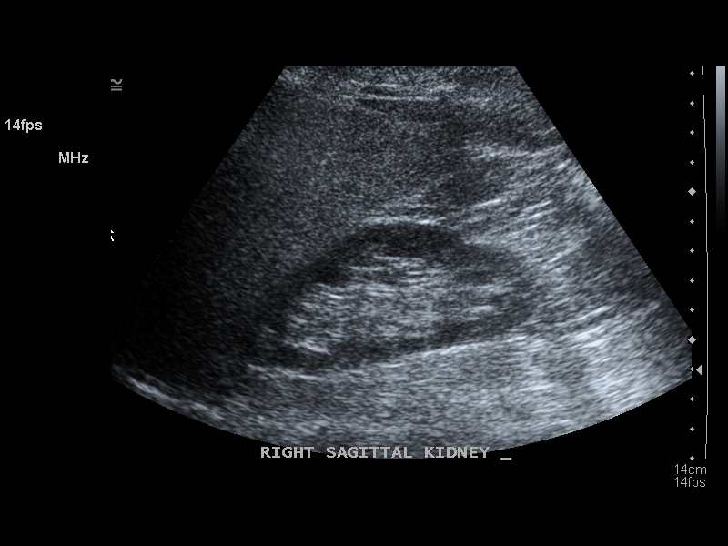
[im 25/46]
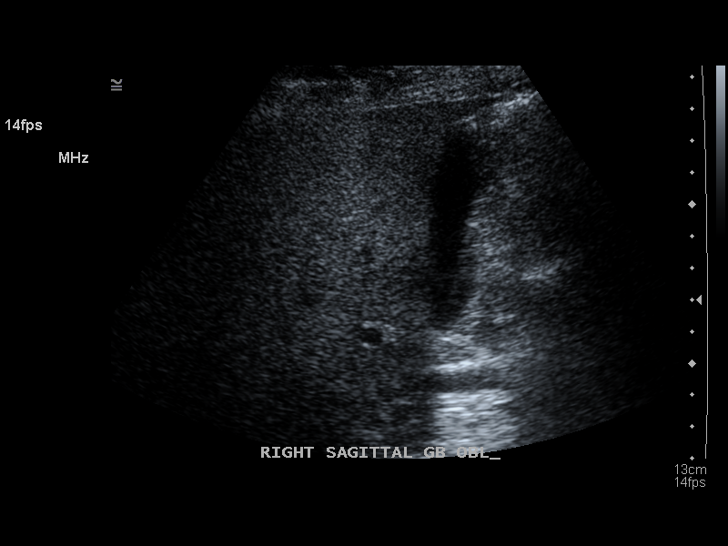
[im 29/46]
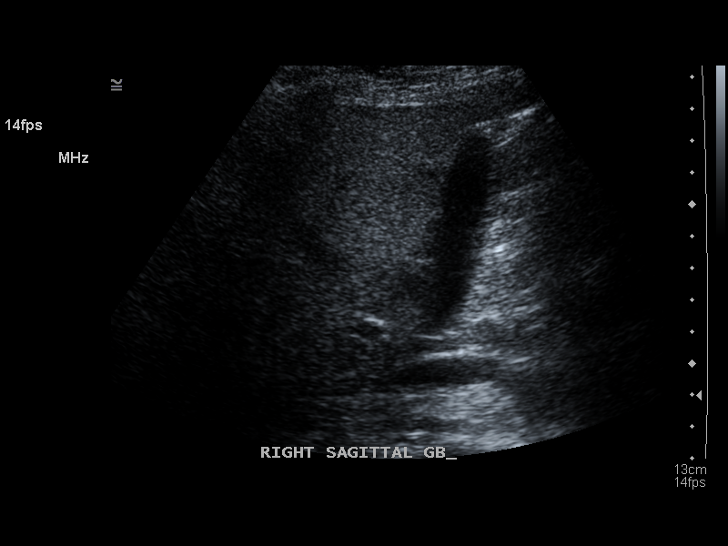
[im 31/46]
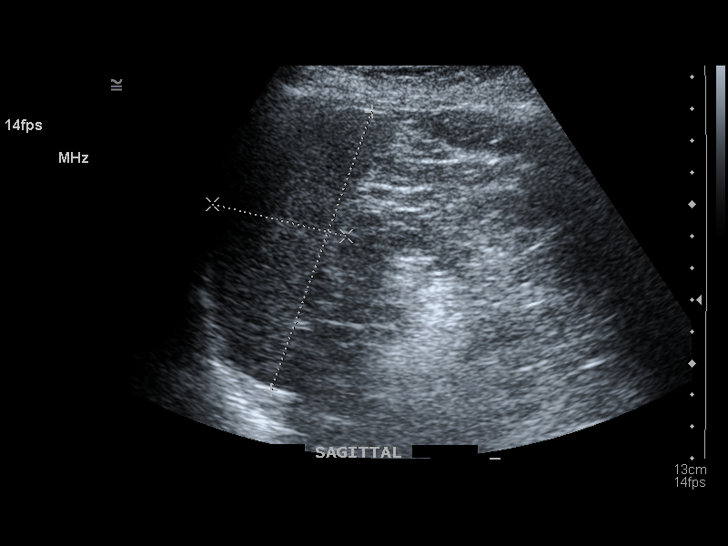
[im 34/46]
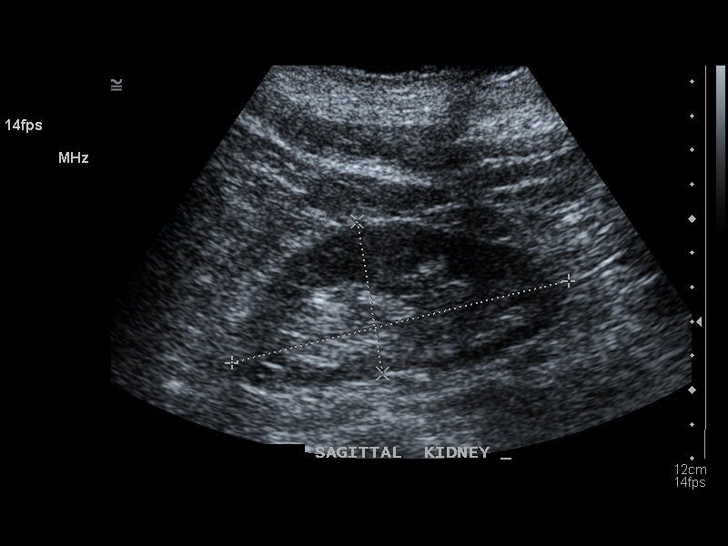
[im 38/46]
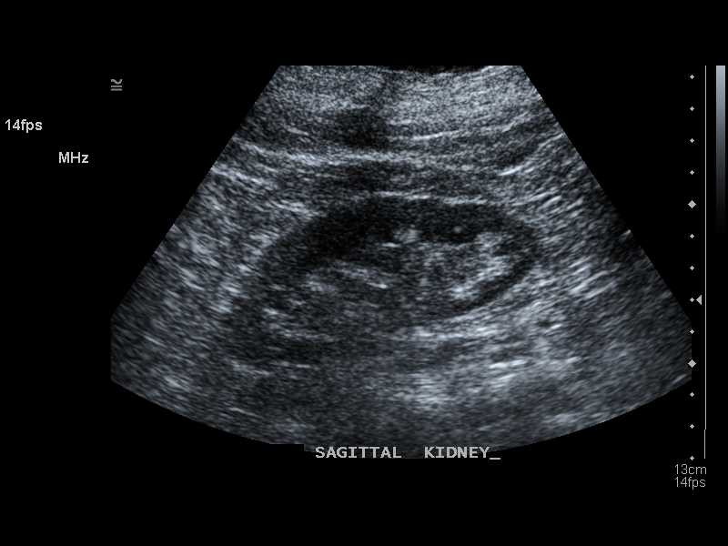
[im 42/46]
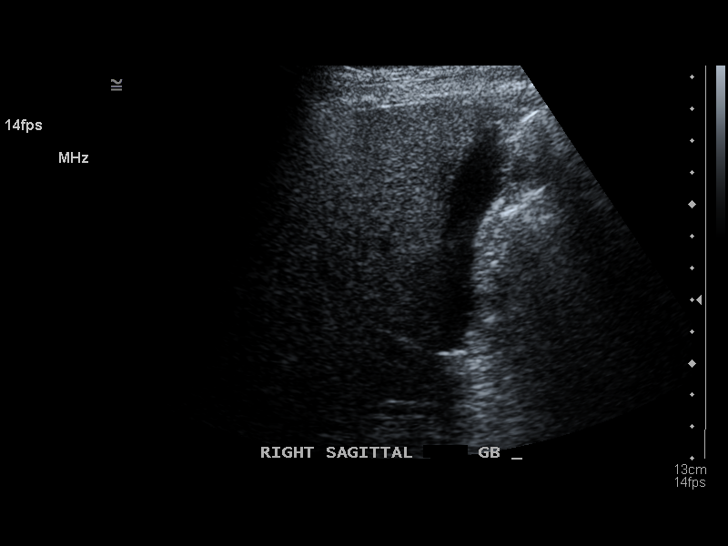
[im 46/46]
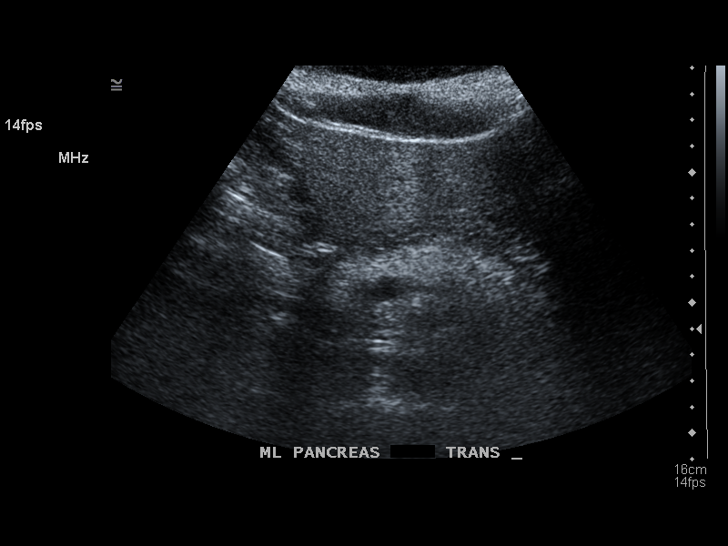

[14 of 25 positions shown; findings below may reference images not displayed]

FINDINGS: Liver is normal in echogenicity. There is no hepatic mass. There is no intra or extrahepatic biliary ductal dilatation. Common bile duct measures 6 mm. No sludge or shadowing gallstones are seen. There is no gallbladder wall thickening or pericholecystic fluid. Pancreas is incompletely visualized due to artifact from overlying bowel gas. Spleen measures 9.5 cm and is unremarkable.

Kidneys are normal in echogenicity and measure 10.5 cm bilaterally. There is no hydronephrosis, mass or cyst on either side.

Visualized abdominal aorta is without aneurysmal dilatation. IVC is normal. There is no ascites.
IMPRESSION: 1. Unremarkable liver. 

2. No evidence of cholelithiasis or acute cholecystitis. 

3. Pancreas incompletely visualized due to artifact from overlying bowel gas.

## 2019-02-26 IMAGING — CT CT THORAX WITHOUT CONTRAST
2 of 4 series · 15 of 36 positions shown, 18 images · non-contrast
Comparison: Chest CT scan dated 06/15/2012.

EXAM:  CT THORAX WITHOUT CONTRAST
INDICATION: Lung nodule, follow-up exam.
TECHNIQUE: Helical noncontrast CT imaging of the chest was performed. Images were reviewed in multiple windows and projections. Exam was performed using 1 or more of the following dose reduction techniques: Automated exposure control, adjustment of the mA and/or kV according to patient size, or the use of iterative reconstruction technique.

[ax · axial · 0.72mm/px · z∈[+678,+917]mm · 12 of 140 slices shown, 15 images]
[im 10/140  mediastinal]
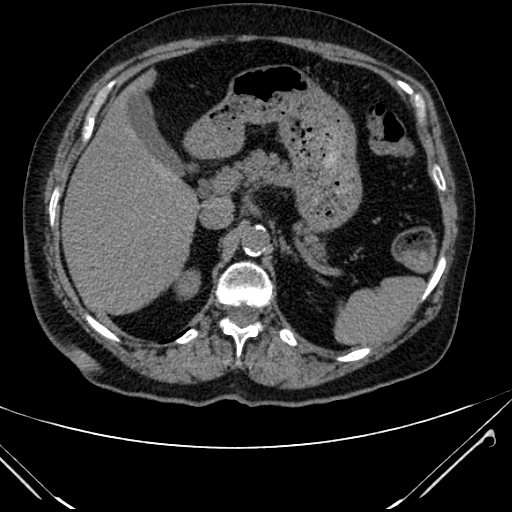
[im 10/140  lung]
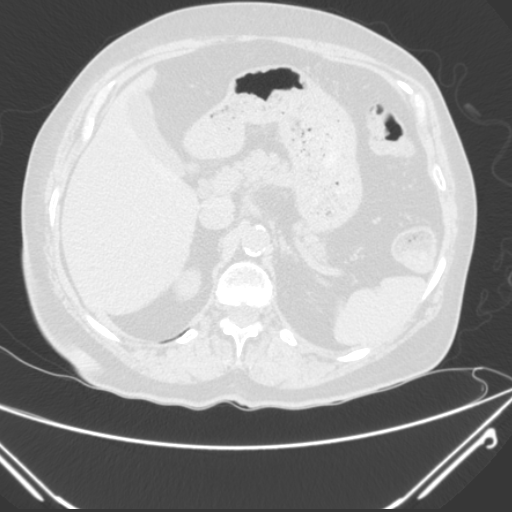
[im 20/140  lung]
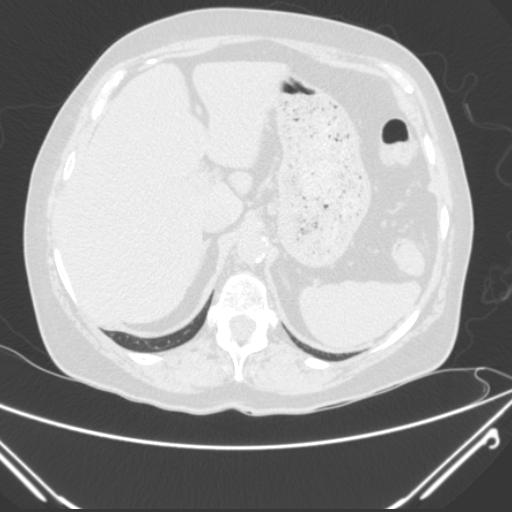
[im 30/140  lung]
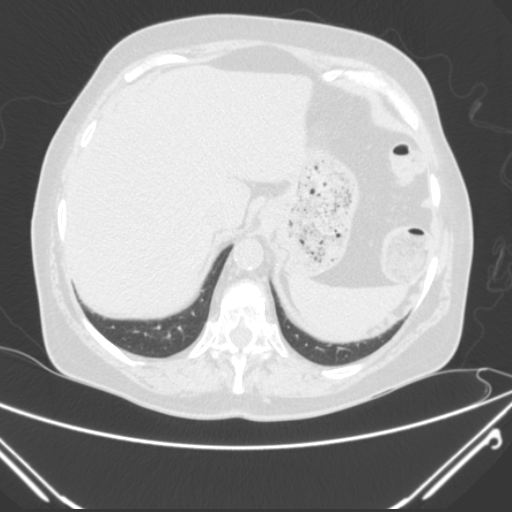
[im 40/140  lung]
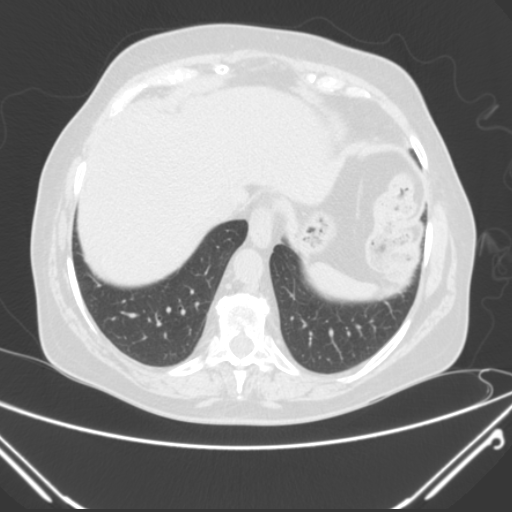
[im 50/140  mediastinal]
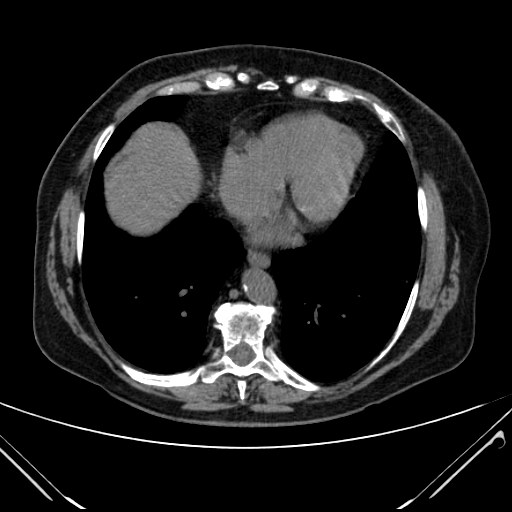
[im 50/140  lung]
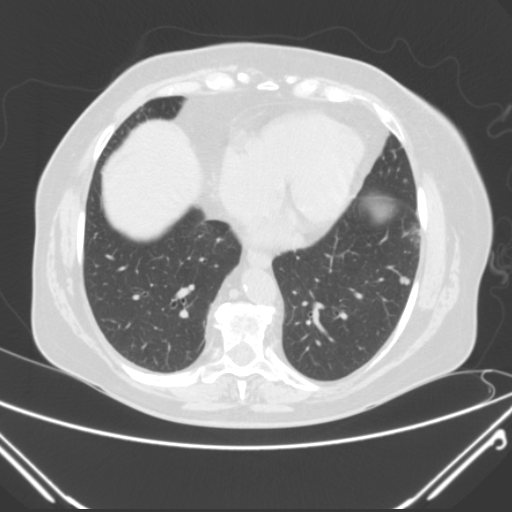
[im 60/140  lung]
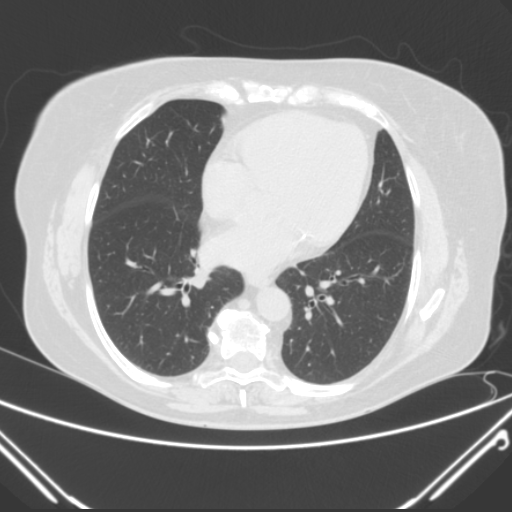
[im 80/140  lung]
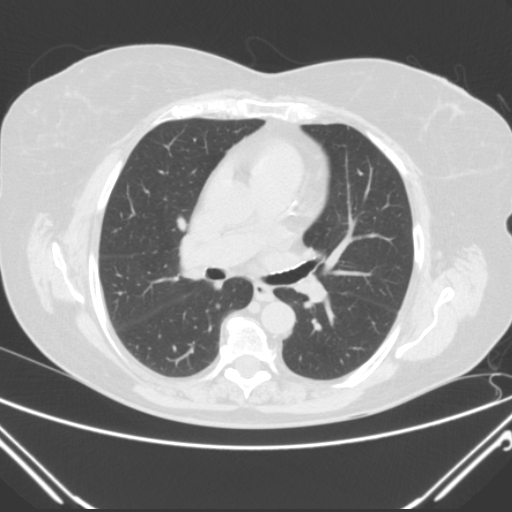
[im 90/140  lung]
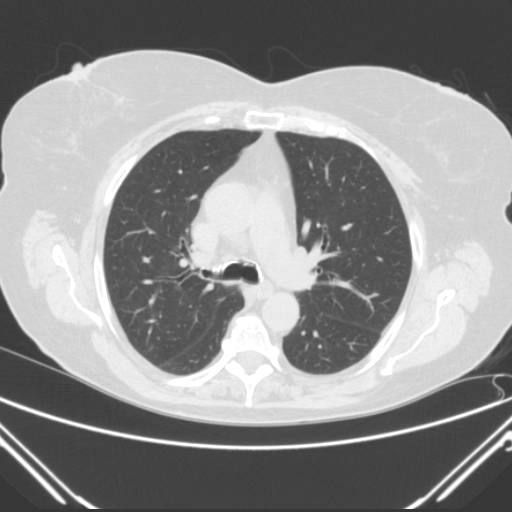
[im 100/140  mediastinal]
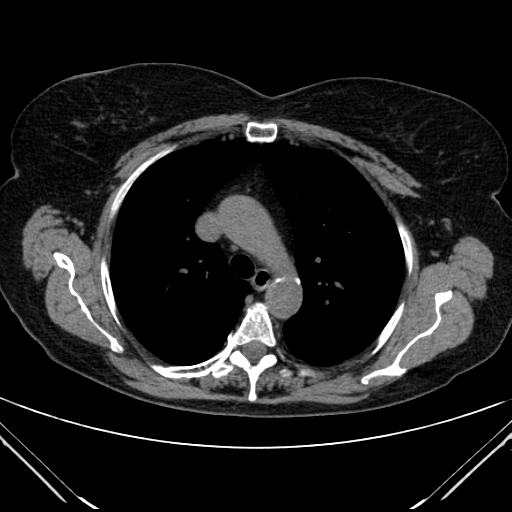
[im 100/140  lung]
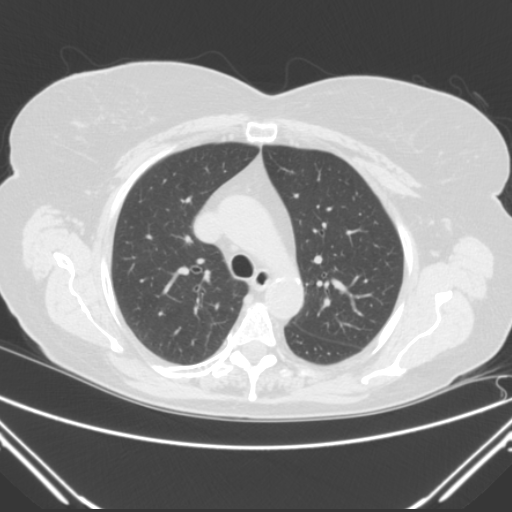
[im 110/140  lung]
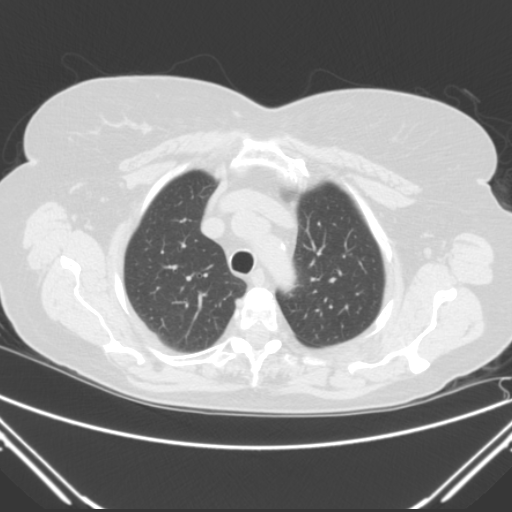
[im 120/140  lung]
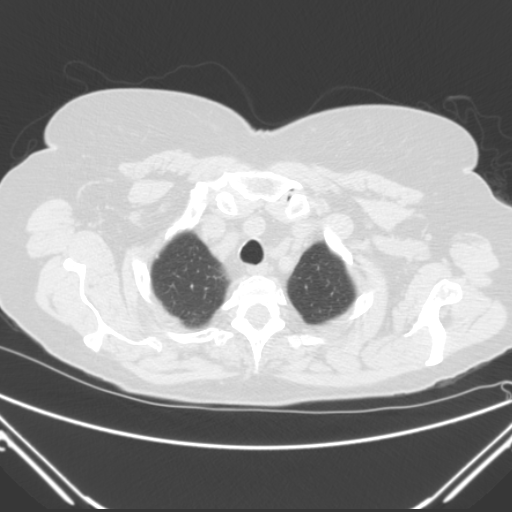
[im 130/140  lung]
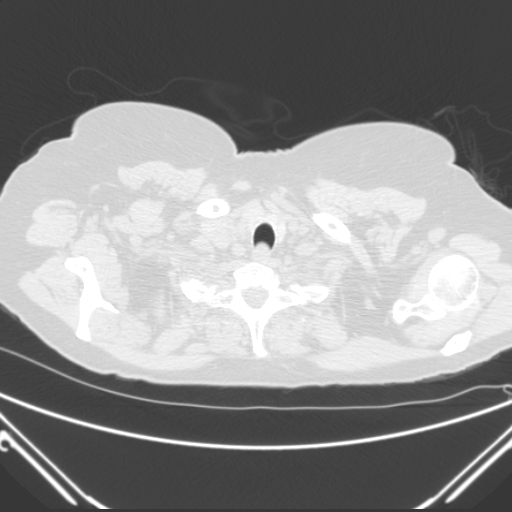

[cor · coronal · 0.72mm/px · 3 of 131 slices shown]
[im 27/131  lung]
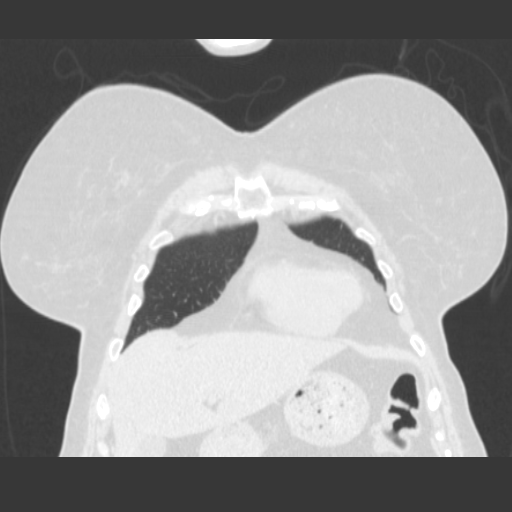
[im 53/131  lung]
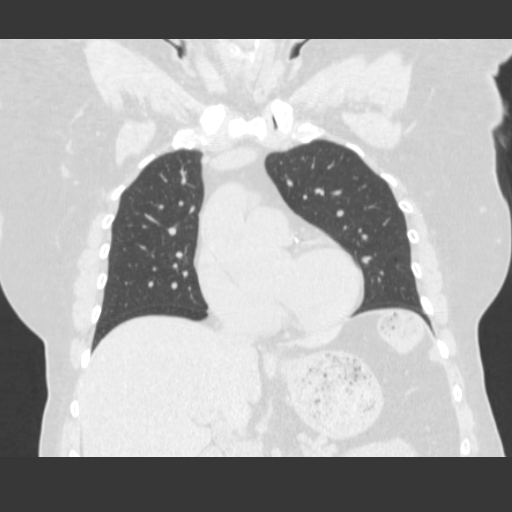
[im 79/131  lung]
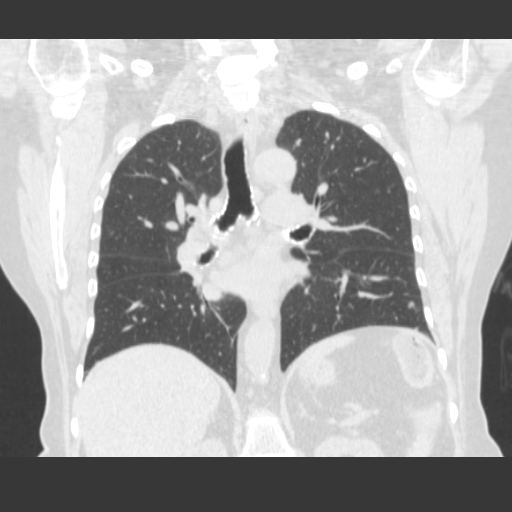

[15 of 36 positions shown; findings below may reference images not displayed]

FINDINGS: Visualized thyroid gland appears unremarkable. Trachea and mainstem bronchi are patent. There is no mediastinal or axillary adenopathy. Evaluation for hilar adenopathy is limited due to lack of intravenous contrast. A few noncalcified lung nodules, measuring up to 6.8 mm within the subpleural left lower lobe, are stable. No new lung nodules are seen. There is no pleural or pericardial effusion. There is no lung consolidation. There is no aortic aneurysm. There are significant coronary artery and mitral annulus calcifications. Visualized upper abdomen is unremarkable. There are advanced multilevel arthritic changes within the mid and lower thoracic spine.
IMPRESSION: Stable subcentimeter noncalcified lung nodules. These are likely benign. 

No new lung consolidation, pleural or pericardial effusion.

## 2020-10-05 IMAGING — CT CT SOFT TISSUE NECK WITH CONTRAST
3 series · 16 of 33 positions shown, 19 images · IV contrast (agent unspecified)
Comparison: None available.

﻿EXAM:  CT SOFT TISSUE NECK WITH CONTRAST
INDICATION: Left-sided cervical adenopathy, cough and hoarseness.
TECHNIQUE: Axial CT imaging of the neck was performed following intravenous administration of 80 mL of Optiray 300. Images were reviewed in multiple windows and projections. Exam was performed using 1 or more of the following dose reduction techniques: Automated exposure control, adjustment of the mA and/or kV according to patient size, or the use of iterative reconstruction technique.

[ax post · axial · 0.44mm/px · z∈[-400,-209]mm · 8 of 58 slices shown, 10 images]
[im 5/58  soft-tissue]
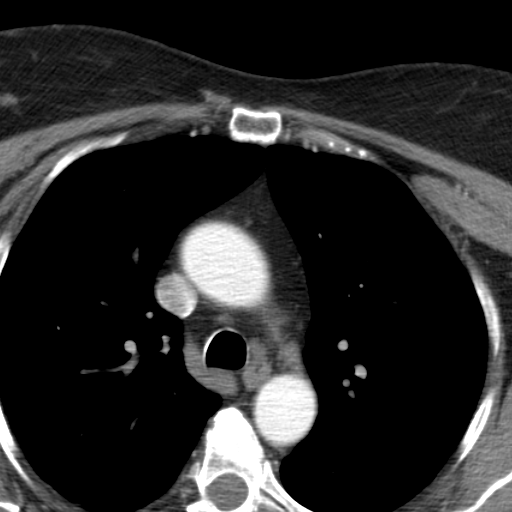
[im 5/58  bone]
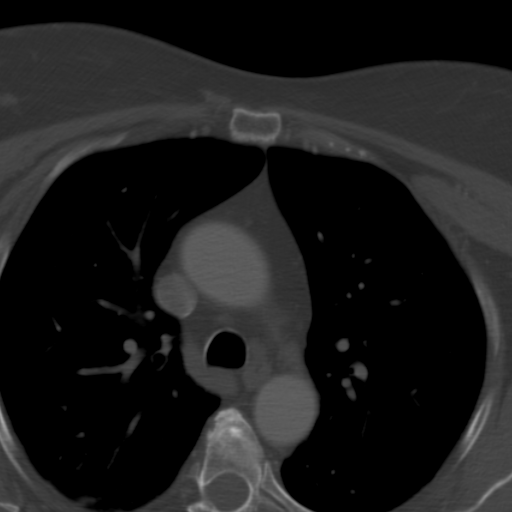
[im 14/58  bone]
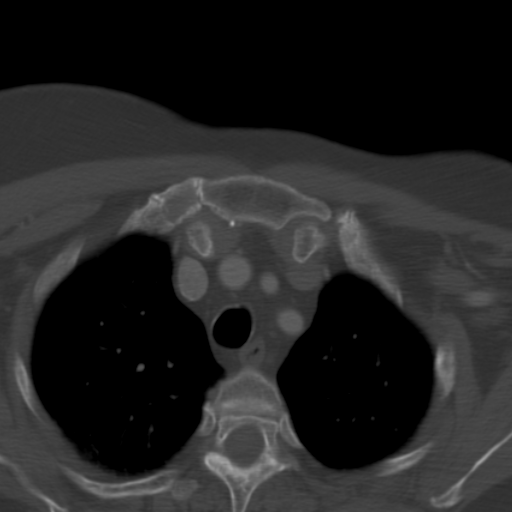
[im 18/58  bone]
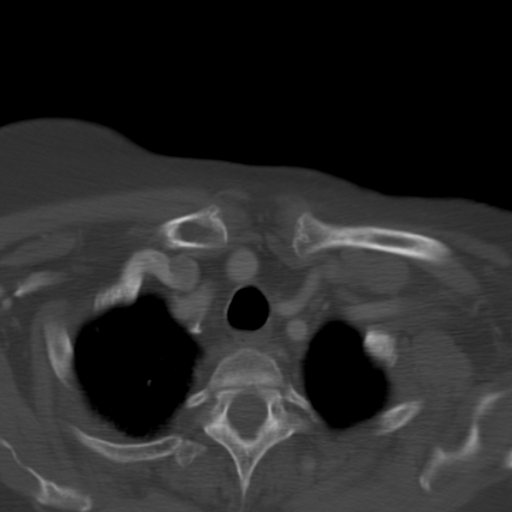
[im 27/58  bone]
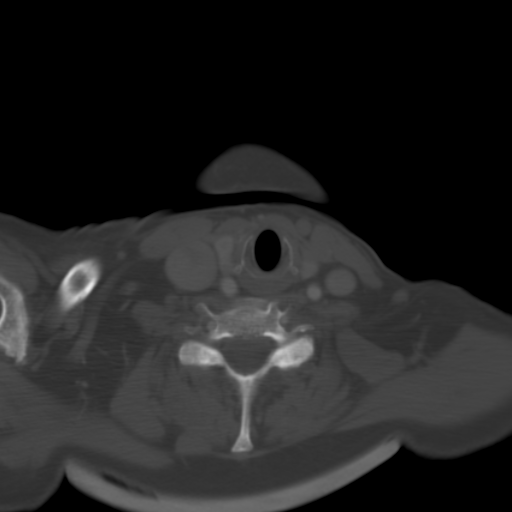
[im 31/58  soft-tissue]
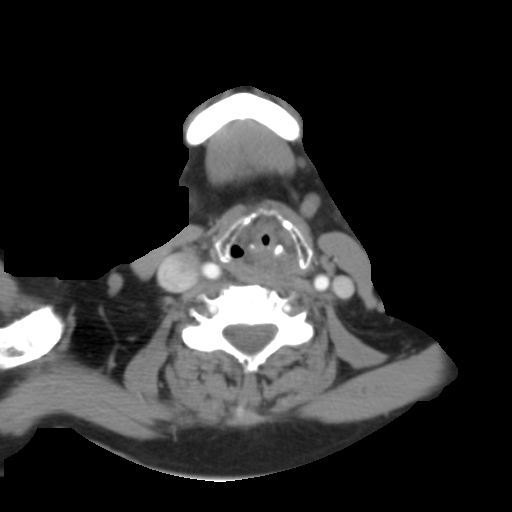
[im 31/58  bone]
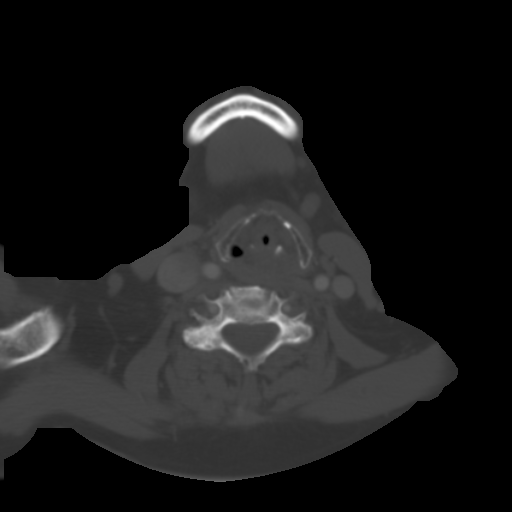
[im 40/58  bone]
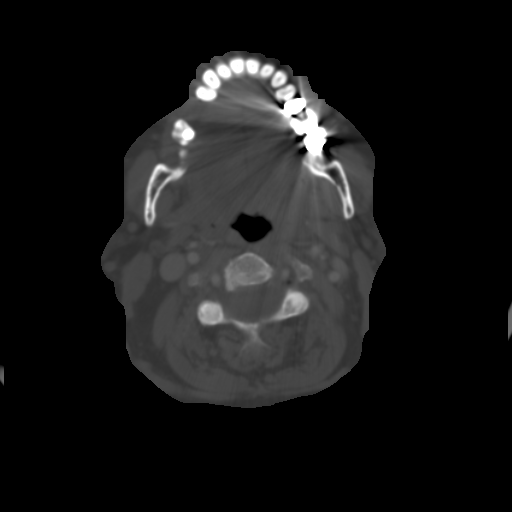
[im 44/58  bone]
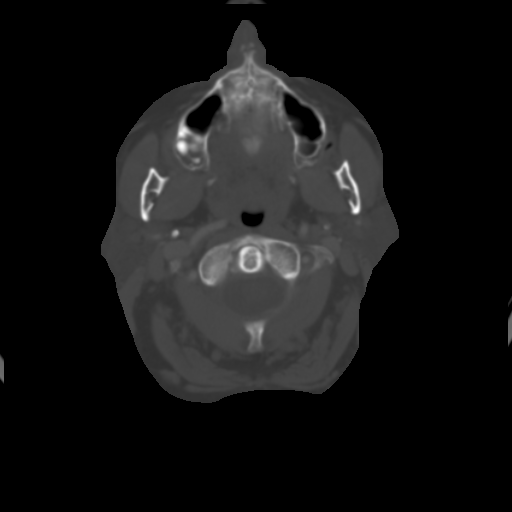
[im 53/58  bone]
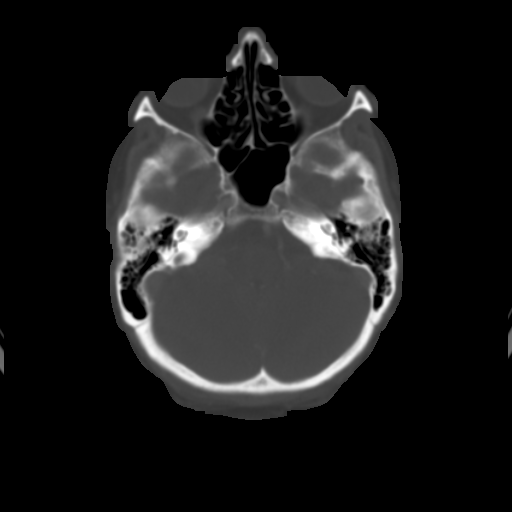

[cor · coronal · 0.59mm/px · 3 of 97 slices shown]
[im 20/97  bone]
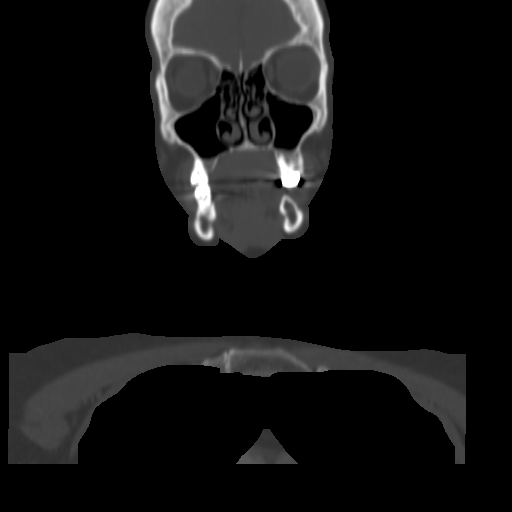
[im 39/97  bone]
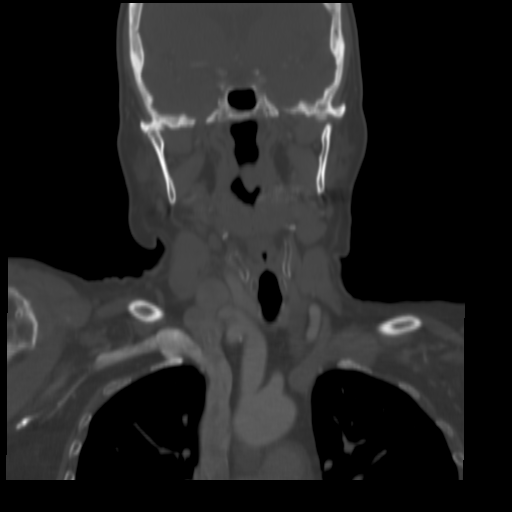
[im 58/97  bone]
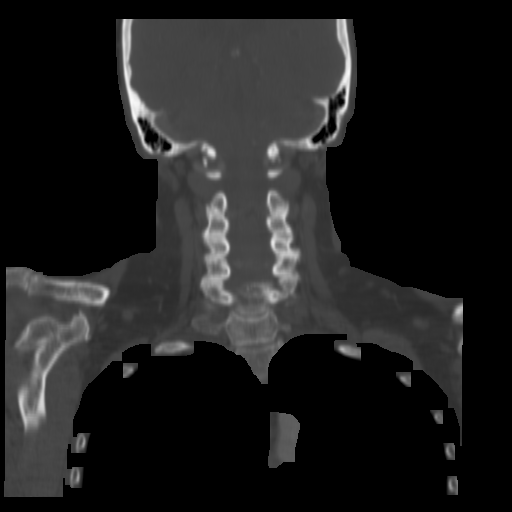

[sag · sagittal · 0.59mm/px · 5 of 87 slices shown, 6 images]
[im 29/87  bone]
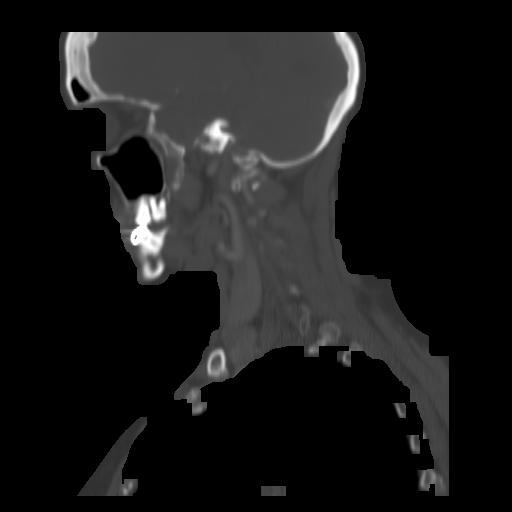
[im 36/87  bone]
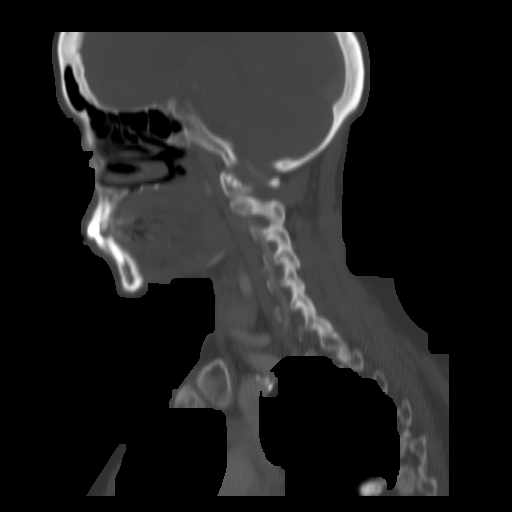
[im 44/87  soft-tissue]
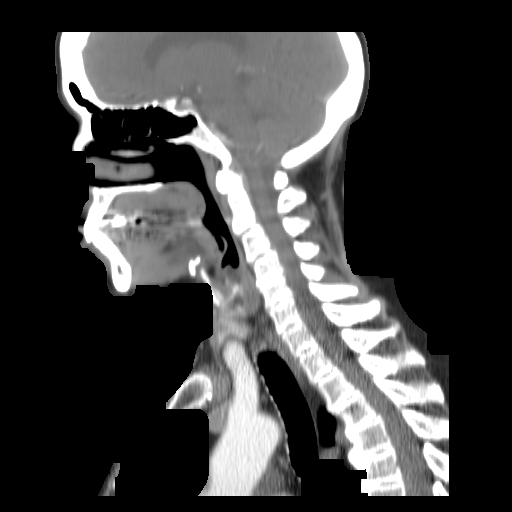
[im 44/87  bone]
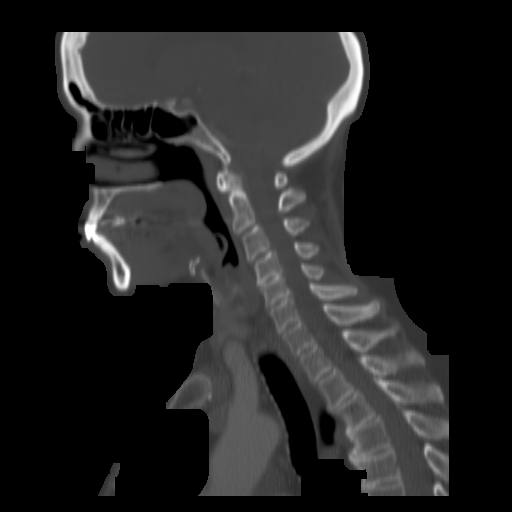
[im 51/87  bone]
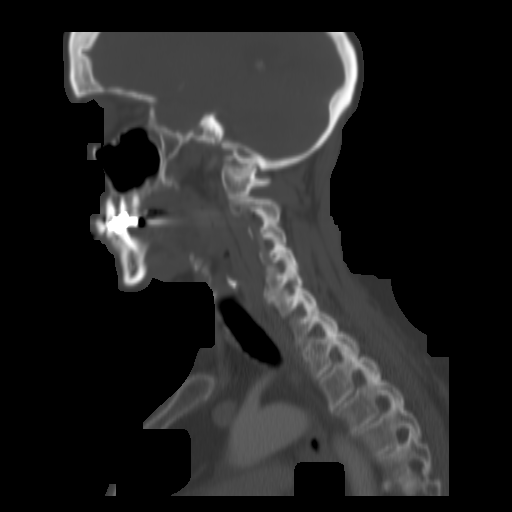
[im 58/87  bone]
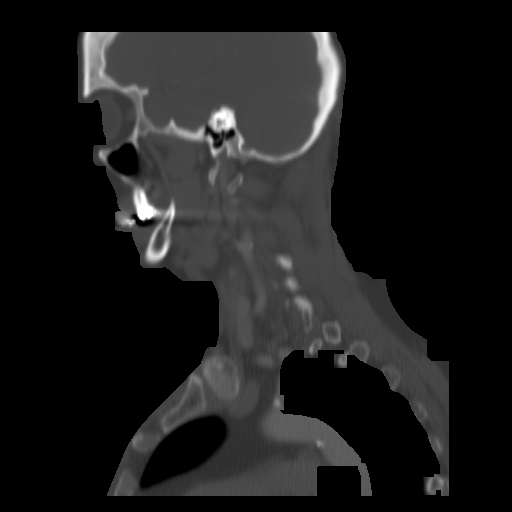

[16 of 33 positions shown; findings below may reference images not displayed]

FINDINGS: The borders of the oro- and nasopharynx are well-defined and fairly symmetric without suspicious soft tissue mass. There is suggestion of small right thyroid nodules. Parotid and submandibular glands are unremarkable. There is no evidence of adenopathy. The parapharyngeal spaces and other fascial compartments of the neck are also well maintained. There is no suspicious soft tissue infiltration. No vascular abnormality or abnormal enhancement is noted following intravenous contrast administration. Lung apices are clear.
IMPRESSION: 1. No suspicious cervical adenopathy. 

2. Suggestion of small right thyroid nodules. Follow-up nonemergent thyroid sonogram is recommended for better assessment.

## 2021-06-18 IMAGING — MR MRI BRAIN W/O CONTRAST
8 of 9 series · 37 of 48 positions shown · IV contrast (gadolinium)
Comparison: Brain MRI dated 10/29/2018.

﻿EXAM:  MRI BRAIN W/O CONTRAST
INDICATION: Dizziness for several months.
TECHNIQUE: Multiplanar, multisequential MRI of the brain was performed without gadolinium contrast.

[Series 5: DWI · axial · 5.0mm · 1.35mm/px · z∈[-63,+57]mm · 10 of 88 slices shown (1 of 3)]
[im 6/88]
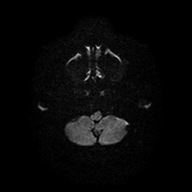
[im 12/88]
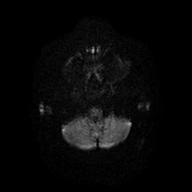
[im 18/88]
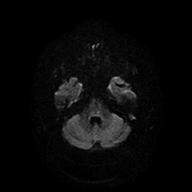
[im 30/88]
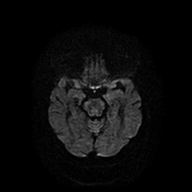
[im 41/88]
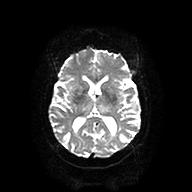
[im 47/88]
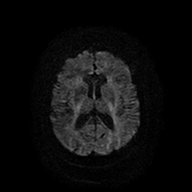
[im 53/88]
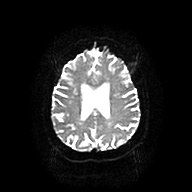
[im 64/88]
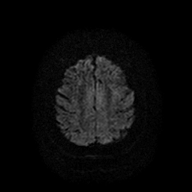
[im 76/88]
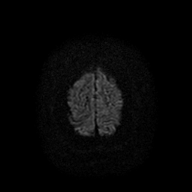
[im 88/88]
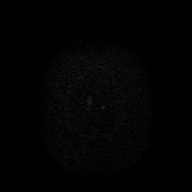

[Series 6: DWI · axial · 5.0mm · 1.35mm/px · z∈[-69,+57]mm · 4 of 22 slices shown (2 of 3)]
[im 1/22]
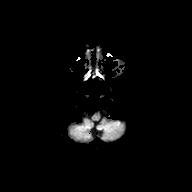
[im 8/22]
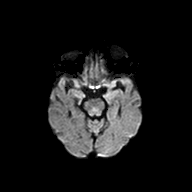
[im 15/22]
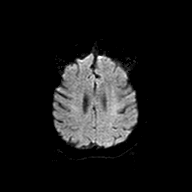
[im 22/22]
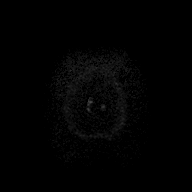

[Series 7: DWI · axial · 5.0mm · 1.35mm/px · z∈[-69,+57]mm · 4 of 22 slices shown (3 of 3)]
[im 1/22]
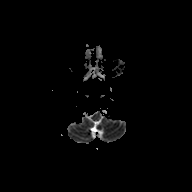
[im 8/22]
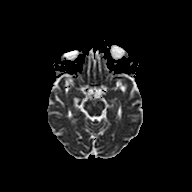
[im 15/22]
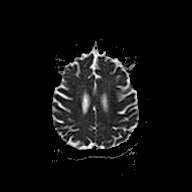
[im 22/22]
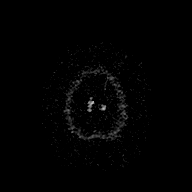

[Series 8: FLAIR · sagittal · 4.0mm · 0.75mm/px · 4 of 26 slices shown (1 of 2)]
[im 1/26]
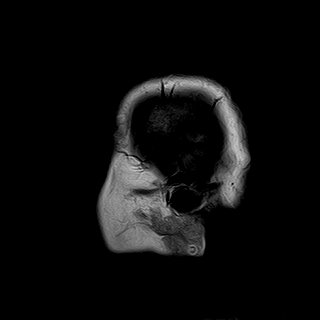
[im 9/26]
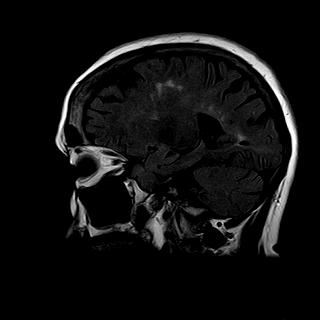
[im 17/26]
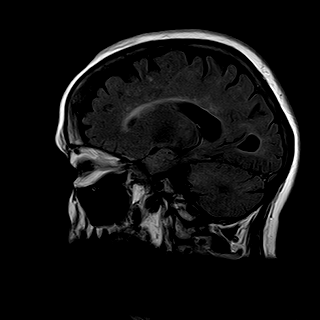
[im 26/26]
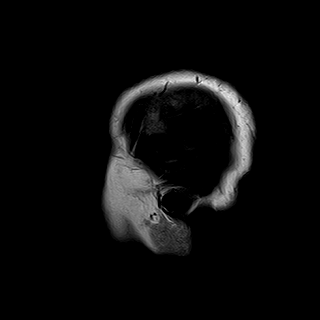

[Series 9: T2 · axial · 5.0mm · 0.43mm/px · z∈[-75,+69]mm · 4 of 25 slices shown (1 of 2)]
[im 1/25]
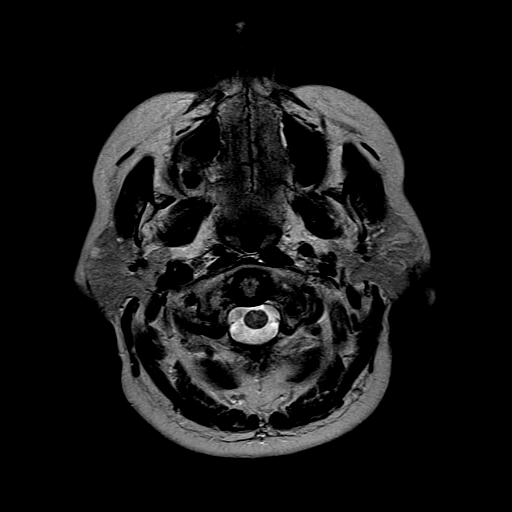
[im 9/25]
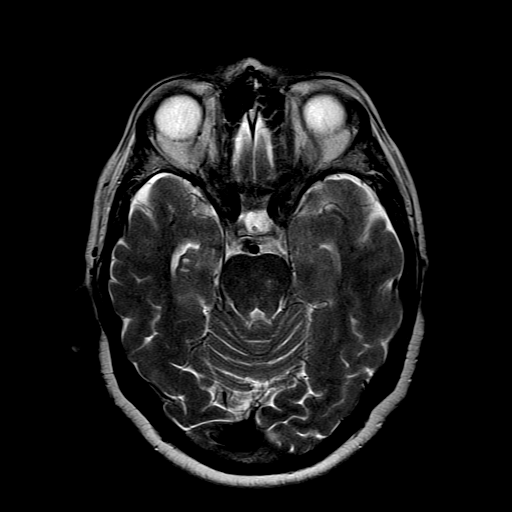
[im 17/25]
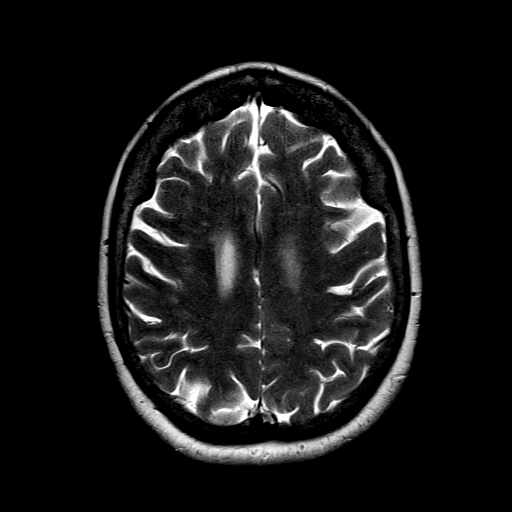
[im 25/25]
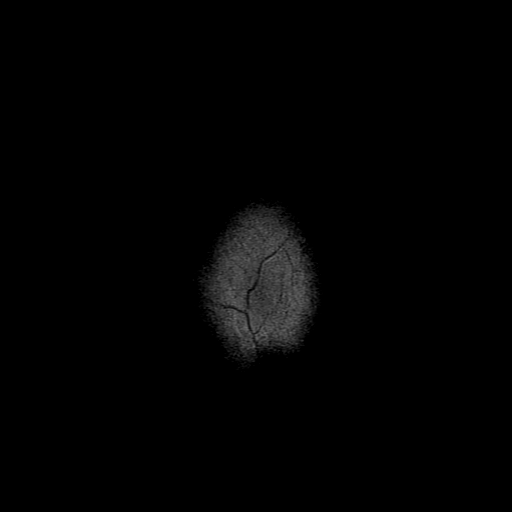

[Series 10: FLAIR · axial · 5.0mm · 0.43mm/px · z∈[-75,+69]mm · 4 of 25 slices shown (2 of 2)]
[im 1/25]
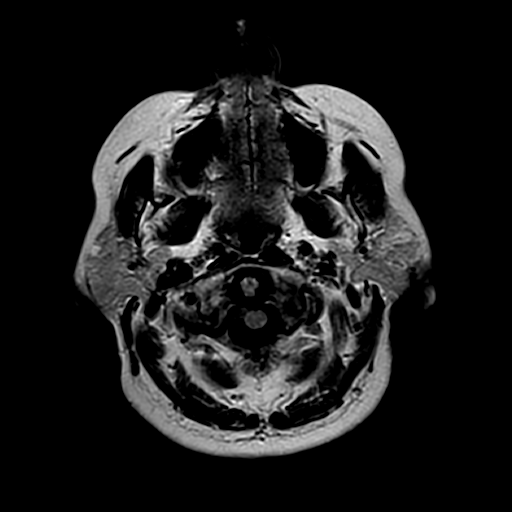
[im 9/25]
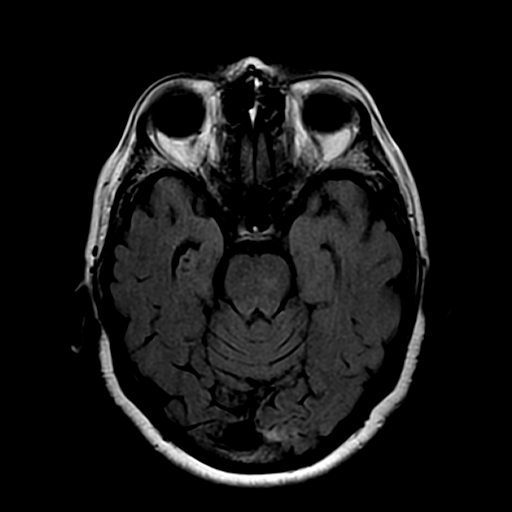
[im 17/25]
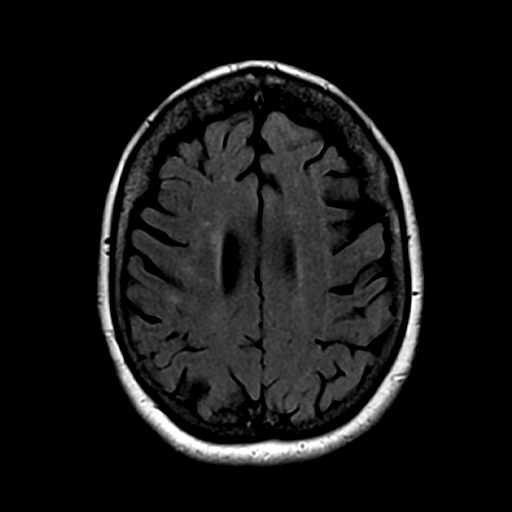
[im 25/25]
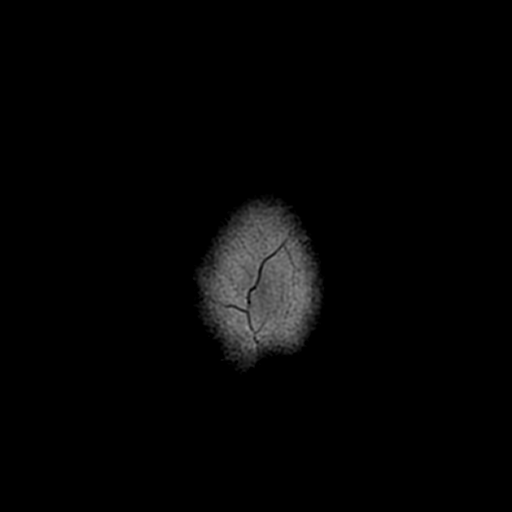

[Series 11: T1 · axial · 5.0mm · 0.43mm/px · z∈[-75,+21]mm · 3 of 25 slices shown]
[im 1/25]
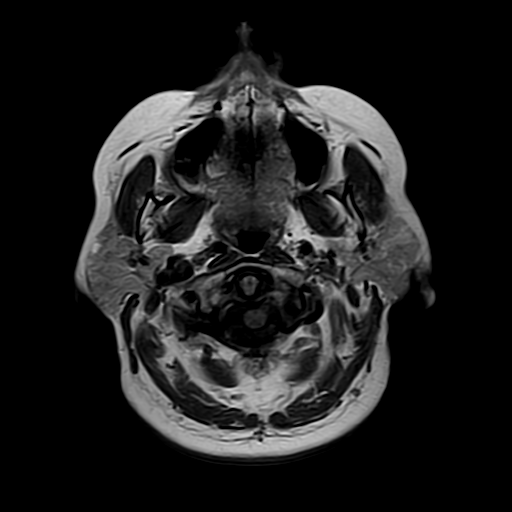
[im 9/25]
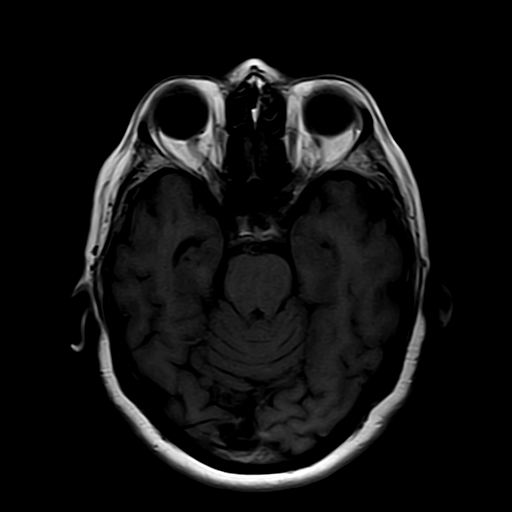
[im 17/25]
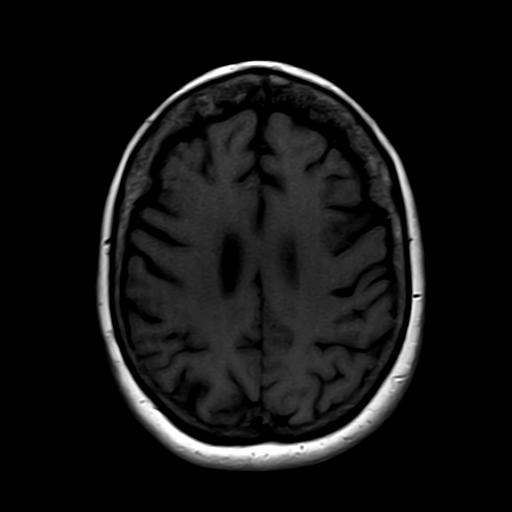

[Series 13: T2 · coronal · 6.2mm · 0.43mm/px · 4 of 26 slices shown (2 of 2)]
[im 1/26]
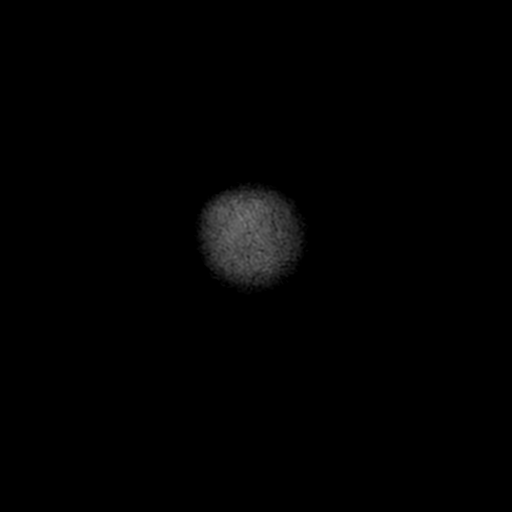
[im 9/26]
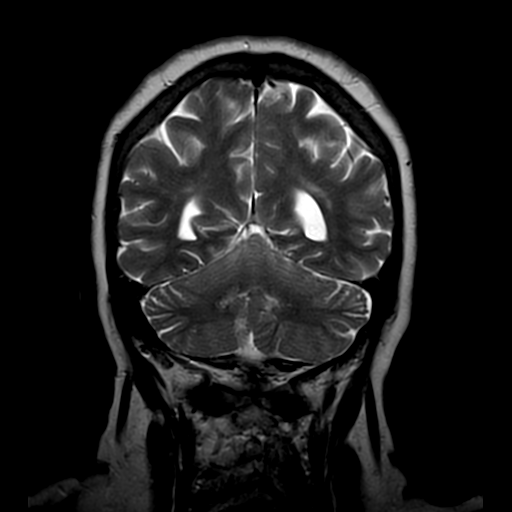
[im 17/26]
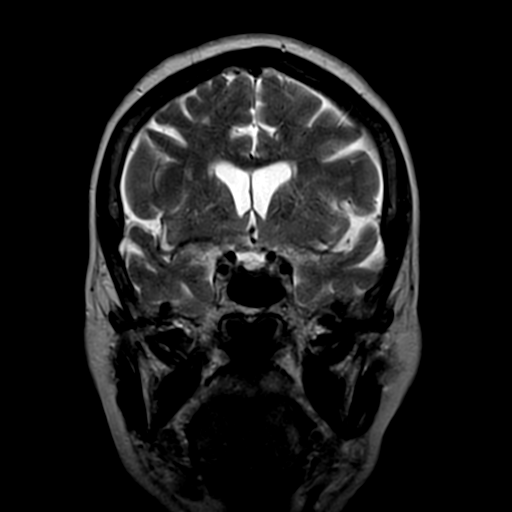
[im 26/26]
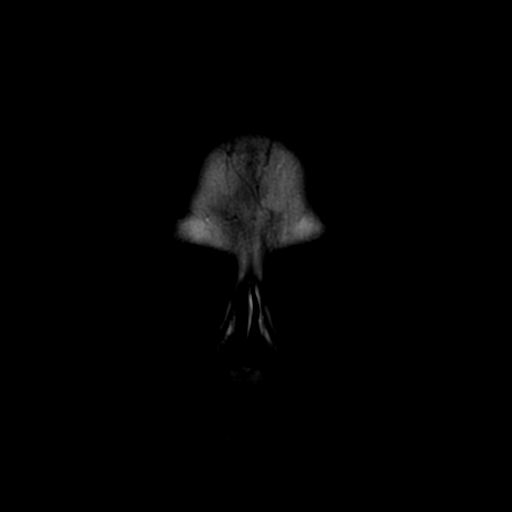

[37 of 48 positions shown; findings below may reference images not displayed]

FINDINGS: Ventricular and sulcal size is normal for the patient's age.  Mild chronic small vessel ischemic changes are again identified.  There is no mass effect, midline shift or intracranial hemorrhage. There is no evidence of acute infarction or prior microhemorrhages. Skull base flow voids and basal cisterns are patent. Sagittal survey of midline structures is unremarkable. There are no extra-axial fluid collections. Visualized paranasal sinuses, mastoid air cells and orbital contents are unremarkable.
IMPRESSION: Stable mild chronic small vessel ischemic changes, no acute intracranial abnormality.

## 2021-07-30 ENCOUNTER — Emergency Department (EMERGENCY_DEPARTMENT_HOSPITAL): Payer: 59

## 2021-07-30 ENCOUNTER — Other Ambulatory Visit: Payer: Self-pay

## 2021-07-30 ENCOUNTER — Emergency Department
Admission: EM | Admit: 2021-07-30 | Discharge: 2021-07-30 | Disposition: A | Discharge status: Home / Self Care | Payer: 59 | Attending: Emergency Medicine | Admitting: Emergency Medicine

## 2021-07-30 ENCOUNTER — Encounter (HOSPITAL_COMMUNITY): Payer: Self-pay

## 2021-07-30 DIAGNOSIS — I252 Old myocardial infarction: Secondary | ICD-10-CM | POA: Insufficient documentation

## 2021-07-30 DIAGNOSIS — R0789 Other chest pain: Secondary | ICD-10-CM | POA: Insufficient documentation

## 2021-07-30 DIAGNOSIS — R079 Chest pain, unspecified: Secondary | ICD-10-CM

## 2021-07-30 HISTORY — DX: Essential (primary) hypertension: I10

## 2021-07-30 HISTORY — DX: Lyme disease, unspecified: A69.20

## 2021-07-30 HISTORY — DX: Gastro-esophageal reflux disease without esophagitis: K21.9

## 2021-07-30 HISTORY — DX: Restless legs syndrome: G25.81

## 2021-07-30 LAB — COMPREHENSIVE METABOLIC PANEL, NON-FASTING
ALBUMIN/GLOBULIN RATIO: 1.9 — ABNORMAL HIGH (ref 0.8–1.4)
ALBUMIN: 4.3 g/dL (ref 3.5–5.7)
ALKALINE PHOSPHATASE: 63 U/L (ref 34–104)
ALT (SGPT): 24 U/L (ref 7–52)
ANION GAP: 9 mmol/L — ABNORMAL LOW (ref 10–20)
AST (SGOT): 25 U/L (ref 13–39)
BILIRUBIN TOTAL: 0.6 mg/dL (ref 0.3–1.2)
BUN/CREA RATIO: 13 (ref 6–22)
BUN: 13 mg/dL (ref 7–25)
CALCIUM, CORRECTED: 8.8 mg/dL — ABNORMAL LOW (ref 8.9–10.8)
CALCIUM: 9.1 mg/dL (ref 8.6–10.3)
CHLORIDE: 107 mmol/L (ref 98–107)
CO2 TOTAL: 23 mmol/L (ref 21–31)
CREATININE: 1.01 mg/dL (ref 0.60–1.30)
ESTIMATED GFR: 60 mL/min/{1.73_m2} (ref >59)
GLOBULIN: 2.3 — ABNORMAL LOW (ref 2.9–5.4)
GLUCOSE: 110 mg/dL — ABNORMAL HIGH (ref 74–109)
OSMOLALITY, CALCULATED: 278 mOsm/kg (ref 270–290)
POTASSIUM: 3.9 mmol/L (ref 3.5–5.1)
PROTEIN TOTAL: 6.6 g/dL (ref 6.4–8.9)
SODIUM: 139 mmol/L (ref 136–145)

## 2021-07-30 LAB — ECG 12 LEAD
Calculated P Axis: 63 degrees
Calculated R Axis: 51 degrees
Calculated T Axis: 61 degrees
QRS Duration: 80 ms
QT Interval: 394 ms
QTC Calculation: 462 ms

## 2021-07-30 LAB — TROPONIN-I
TROPONIN I: 6 ng/L (ref <15)
TROPONIN I: 6 ng/L (ref <15)
TROPONIN I: 5 ng/L (ref <15)

## 2021-07-30 LAB — GOLD TOP TUBE

## 2021-07-30 LAB — CBC WITH DIFF
BASOPHIL #: 0 10*3/uL (ref 0.00–2.50)
BASOPHIL %: 1 % (ref 0–3)
EOSINOPHIL #: 0.2 10*3/uL (ref 0.00–2.40)
EOSINOPHIL %: 3 % (ref 0–7)
HCT: 39.2 % (ref 37.0–47.0)
HGB: 13.8 g/dL (ref 12.5–16.0)
LYMPHOCYTE #: 1.6 10*3/uL — ABNORMAL LOW (ref 2.10–11.00)
LYMPHOCYTE %: 30 % (ref 25–45)
MCH: 32.4 pg — ABNORMAL HIGH (ref 27.0–32.0)
MCHC: 35.1 g/dL (ref 32.0–36.0)
MCV: 92.2 fL (ref 78.0–99.0)
MONOCYTE #: 0.5 10*3/uL (ref 0.00–4.10)
MONOCYTE %: 9 % (ref 0–12)
MPV: 8 fL (ref 7.4–10.4)
NEUTROPHIL #: 2.9 10*3/uL — ABNORMAL LOW (ref 4.10–29.00)
NEUTROPHIL %: 56 % (ref 40–76)
PLATELETS: 216 10*3/uL (ref 140–440)
RBC: 4.25 10*6/uL (ref 4.20–5.40)
RDW: 14 % (ref 11.6–14.8)
WBC: 5.2 10*3/uL (ref 4.0–10.5)
WBCS UNCORRECTED: 5.2 10*3/uL

## 2021-07-30 LAB — CREATINE KINASE (CK), TOTAL, SERUM: CREATINE KINASE: 38 U/L (ref 30–223)

## 2021-07-30 LAB — BLUE TOP TUBE

## 2021-07-30 LAB — THYROID STIMULATING HORMONE (SENSITIVE TSH): TSH: 2.499 u[IU]/mL (ref 0.450–5.330)

## 2021-07-30 LAB — B-TYPE NATRIURETIC PEPTIDE: BNP: 67 pg/mL (ref 5–100)

## 2021-07-30 LAB — MAGNESIUM: MAGNESIUM: 1.9 mg/dL (ref 1.9–2.7)

## 2021-07-30 MED ORDER — ASPIRIN 81 MG CHEWABLE TABLET
CHEWABLE_TABLET | ORAL | Status: AC
Start: 2021-07-30 — End: 2021-07-30
  Filled 2021-07-30: qty 4

## 2021-07-30 MED ORDER — ASPIRIN 81 MG CHEWABLE TABLET
324.0000 mg | CHEWABLE_TABLET | ORAL | Status: AC
Start: 2021-07-30 — End: 2021-07-30
  Administered 2021-07-30: 324 mg via ORAL

## 2021-07-30 NOTE — ED APP Handoff Note (Signed)
[facility]  Emergency Department  Provider in Triage Note    Name: Judy Hicks  Age: 71 y.o.  Gender: female     Subjective:   Judy Hicks is a 71 y.o. female who presents with complaint of No chief complaint on file.  Marland Kitchen  Pt presents to the ER for evaluation of chest pain in upper chest near the bottom of her throat, sob, and high blood pressure this am. She states she did take a NTG which did not help pta. She did not take asa. She states she has had right arm pain/numbness x 1 week. Pain in chest 5/10 and is described as sharp. Nothing makes s/s better or worse. She states she did feel nauseated but no vomiting. She denies dizziness.     Objective:   There were no vitals filed for this visit.   Focused Physical Exam shows   General: PT sitting in chair. Appears in pain. Resp even and non-labored.     Assessment:  A medical screening exam was completed.  This patient is a 71 y.o. female with initial findings showing chest pain    Plan:  Please see initial orders and work-up below.  This is to be continued with full evaluation in the main Emergency Department.     No current facility-administered medications for this encounter.     No results found for this or any previous visit (from the past 24 hour(s)).     Juanda Crumble, APRN  07/30/2021, 12:49

## 2021-07-30 NOTE — Discharge Instructions (Signed)
Use Voltaren gel four times daily when you have this pain.  Follow up with your family doctor and Dr. Renda Rolls. Return if worse.

## 2021-07-30 NOTE — ED Triage Notes (Signed)
States upper chest pain that goes into throat last night. States shortness of breath. States took nitro sl x 1 approx one hour ago. History of mi.

## 2021-07-30 NOTE — ED Provider Notes (Signed)
Emergency Medicine    Name: Judy Hicks  Age and Gender: 71 y.o. female  Date of Birth: 09-21-1950  MRN: [id number]  PCP: [facility]    CC:  Chief Complaint   Patient presents with    Chest Pain        HPI:  Judy Hicks is a 71 y.o. White female with history of chest pain. 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 Renda Hicks, cardiology.     She had a cardiac cath in 2020 with minimal disease (referenced below).    Today she developed pain in her right chest, of intermittent but constant intensity/tightness.  She reports mild dyspnea and nausea.  She has no pain with exertion.  She denies fever, cough, cold symptoms or trauma/lifting.      [place] Pain Rating Scale     On a scale of 0-10, during the past 24 hours, pain has interfered with you usual activity:       On a scale of 0-10, during the past 24 hours, pain has interfered with your sleep:      On a scale of 0-10, during the past 24 hours, pain has affected your mood:       On a scale of 0-10, during the past 24 hours, pain has contributed to your stress:       On a scale of 0-10, what is your overall pain Rating: 5        Below pertinent information reviewed with patient:  Past Medical History:   Diagnosis Date    GERD (gastroesophageal reflux disease)     HTN (hypertension)     Lyme disease     Restless legs syndrome      Hx of MI.  Hx of sternum fracture many years ago.    Allergies   Allergen Reactions    Penicillins Shortness of Breath    Statins-Hmg-Coa Reductase Inhibitors Shortness of Breath       Past Surgical History:   Procedure Laterality Date    HX HIP REPLACEMENT Bilateral            Social History        Objective:    ED Triage Vitals [07/30/21 1255]   BP (Non-Invasive) (!) 150/104   Heart Rate 84   Respiratory Rate 19   Temperature 36.3 C (97.3 F)   SpO2 99 %   Weight 95.3 kg (210 lb)   Height 1.676 m ( )     Filed Vitals:    07/30/21 1255   BP: (!) 150/104   Pulse: 84   Resp: 19   Temp: 36.3 C (97.3  F)   SpO2: 99%       Nursing notes and vital signs reviewed.    Constitutional - No acute distress.  Holding right side of chest.  HEENT - Normocephalic. Atraumatic. PERRL. EOMI. Conjunctiva clear. Oropharynx with no erythema, lesions, or exudates. Moist mucous membranes.   Neck - Trachea midline. No stridor. No hoarseness.  Cardiac - Regular rate and rhythm. No murmurs, rubs, or gallops. Chest non-tender, no crepitus or deformity.  Respiratory - Clear to auscultation bilaterally. No rales, wheezes or rhonchi.  Abdomen - Non-tender, soft, non-distended. No rebound or guarding.   Musculoskeletal - Good AROM. No muscle or joint tenderness appreciated. No clubbing, cyanosis or edema. Calves non-tender, no swelling.  Skin - Warm and dry, without any rashes or other lesions.  Neuro - Cranial nerves  II-XII are grossly intact.  Moving all extremities symmetrically.    Any pertinent labs and imaging obtained during this encounter reviewed below in MDM.    MDM/ED Course:    EKG shows a sinus rhythm, rate of 83. Normal PR and QRS. No STEMI. Normal EKG.       Orders Placed This Encounter    XR AP MOBILE CHEST    CBC/DIFF    COMPREHENSIVE METABOLIC PANEL, NON-FASTING    TROPONIN-I NOW    TROPONIN-I IN ONE HOUR    TROPONIN-I IN THREE HOURS    B-TYPE NATRIURETIC PEPTIDE    CREATINE KINASE (CK), TOTAL, SERUM    MAGNESIUM    THYROID STIMULATING HORMONE (SENSITIVE TSH)    CBC WITH DIFF    EXTRA TUBES    BLUE TOP TUBE    GOLD TOP TUBE    ECG 12 LEAD    INSERT & MAINTAIN PERIPHERAL IV ACCESS    aspirin chewable tablet 324 mg       Cardiac cath, 2020      [facility] [place], [place] [phone]  Patient:  Judy Hicks Acct #: [id number]  Requisition #: Exam Date:   Order #:  Unit #: [id number]  Att/Ref Physician:   Verta Ellen M.D. [facility] Patient Location: [facility]  Ordering Physician: Room Number:  DOB: [birth year]          Age/Sex: 71/F      Status:  REG  REF    CARDIAC CATHETERIZATIONSigned                                                                                                            EXAM ORDERED:    REASON FOR EXAM:     ADDITIONAL NOTES/SPECIAL INSTRUCTIONS:     FINDINGS:    Judy Hicks     DATE:  10/19/2018     INDICATIONS:   Unstable angina with continued exertional chest tightness and shortness of breath on maximum medical therapy, hypertension, hyperlipidemia.  A stress test nondiagnostic.     PROCEDURES PERFORMED:   Left heart catheterization, selective coronary arteriogram, left ventriculogram, moderate sedation.     The patient is an appropriate candidate for moderate sedation.  She was 1 mg Versed and 25 mg of fentanyl at 8:40 a.m. and she was observed continuously up until 9:15 a.m.  She did great.  She was observed for heart rate, blood pressure, oxygen saturation and rhythm.     DETAILS OF PROCEDURE:  After the right groin was prepared, local anesthesia was given using 2% lidocaine under sterile conditions.  Using the Seldinger technique right femoral artery was cannulated and 6-French sheath introduced in the artery.  Then, using J guidewire, 6-French JL4 and JR4 catheters were introduced to cannulate the left main coronary artery and right coronary artery, respectively.  Multiple images were taken in LAO and RAO projections.  After that, 6-French pigtail catheter introduced into the left ventricle without any difficulty and left ventriculogram done in RAO projection using 10 to 20 cc of  dye in 2 seconds.  Central aortic pressure was taken at the beginning of the procedure and at the end of the procedure.  After the procedure the sheath was removed and local hemostasis achieved by local manual compression or Angio-Seal VIP.  The patient tolerated the procedure well without any complications.  The patient was stable at the end of the procedure and sent to the surgical outpatient ward for followup care.     Hemostasis achieved using  Angio-Seal VIP.     COMPLICATIONS:   None.      Fluoroscopy:  Mild to moderate calcification was seen in the left anterior descending artery in the proximal and distal segment.     HEMODYNAMICS:   Aortic pressure 123/65, mean 84.  Left ventricle 121/4-12 mmHg.  On pullback, there was no pressure gradient across the aortic valve.     Selective coronary arteriogram:  1.  Left main coronary artery divides into left anterior descending artery, left circumflex coronary artery looks normal.  2.  Left anterior descending artery is a medium-sized vessel, which wraps from the apex.  There is a calcified lesion in the proximal segment after the first diagonal branch which appears to be 30-40%.  The mid to distal vessel has diffuse moderate disease with calcification and becomes very small vessel in the apex.  Nitroglycerin was given, which plumped up the artery better than otherwise.  Diagonal branch is a small to medium-sized vessel which has diffuse distal disease with small vessels.  Left circumflex coronary artery is small, nondominant vessel which gives rise to a large obtuse marginal branch.  The circumflex coronary artery itself was very small in the AV groove.  The marginal branch is very large tortuous vessel which has mild distal disease.  3.  Right coronary artery is a medium-sized dominant vessel which is angiographically normal. Posterior descending artery branch is a small to medium size vessel, which reaches up to the apex.  No significant focal disease.  Posterolateral branch is a small branch.  4.  Left ventriculogram:  Normal left ventricular wall motion, systolic function, ejection fraction about 65%.  No mitral regurgitation.     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  Small distal vessel disease, normal left ventriculogram.     RECOMMENDATIONS:   Medical  therapy.        DD: 10/19/2018 09:24  DT: 10/20/2018 08:28/RD  Job #: [id number]        JAVED-[name]:RD       Patient's workup is negative for any evidence of coronary ischemia. She has no findings of PE. She has had episodes of chest pain many times in the past, although she says this episode is worse than previous.  Her pain, however, is already improving and she says she never takes anything for it.    I wonder if her pain is related to arthritis from her old sternal fracture. I have suggested she follow up with her cardiologist and use Voltaren gel four times daily.    She is discharged with her husband.    Impression:   Clinical Impression   Chest pain, non-cardiac (Primary)       Disposition: Discharged          Portions of this note may have been dictated using voice recognition software.     Cain Saupe, MD  [facility] ED    -----------------------  Results for orders placed or performed during the hospital encounter of 07/30/21 (from the past 12 hour(s))   COMPREHENSIVE METABOLIC PANEL, NON-FASTING   Result Value Ref Range    SODIUM 139 136 - 145 mmol/L    POTASSIUM 3.9 3.5 - 5.1 mmol/L    CHLORIDE 107 98 - 107 mmol/L    CO2 TOTAL 23 21 - 31 mmol/L    ANION GAP 9 (L) 10 - 20 mmol/L    BUN 13 7 - 25 mg/dL    CREATININE 1.21 9.75 - 1.30 mg/dL    BUN/CREA RATIO 13 6 - 22    ESTIMATED GFR 60 >59 mL/min/1.62m2    ALBUMIN 4.3 3.5 - 5.7 g/dL    CALCIUM 9.1 8.6 - 88.3 mg/dL    GLUCOSE 254 (H) 74 - 109 mg/dL    ALKALINE PHOSPHATASE 63 34 - 104 U/L    ALT (SGPT) 24 7 - 52 U/L    AST (SGOT) 25 13 - 39 U/L    BILIRUBIN TOTAL 0.6 0.3 - 1.2 mg/dL    PROTEIN TOTAL 6.6 6.4 - 8.9 g/dL    ALBUMIN/GLOBULIN RATIO 1.9 (H) 0.8 - 1.4    OSMOLALITY, CALCULATED 278 270 - 290 mOsm/kg    CALCIUM, CORRECTED 8.8 (L) 8.9 - 10.8 mg/dL    GLOBULIN 2.3 (L) 2.9 - 5.4   TROPONIN-I NOW   Result Value Ref Range    TROPONIN I 6 <15 ng/L   CREATINE KINASE (CK), TOTAL, SERUM   Result Value Ref Range    CREATINE KINASE 38 30 - 223  U/L   MAGNESIUM   Result Value Ref Range    MAGNESIUM 1.9 1.9 - 2.7 mg/dL   CBC WITH DIFF   Result Value Ref Range    WBCS UNCORRECTED 5.2 x10^3/uL    WBC 5.2 4.0 - 10.5 x103/uL    RBC 4.25 4.20 - 5.40 x106/uL    HGB 13.8 12.5 - 16.0 g/dL    HCT 98.2 64.1 - 58.3 %    MCV 92.2 78.0 - 99.0 fL    MCH 32.4 (H) 27.0 - 32.0 pg    MCHC 35.1 32.0 - 36.0 g/dL    RDW 09.4 07.6 - 80.8 %    PLATELETS 216 140 - 440 x103/uL    MPV 8.0 7.4 - 10.4 fL    NEUTROPHIL % 56 40 - 76 %    LYMPHOCYTE % 30 25 - 45 %    MONOCYTE % 9 0 - 12 %    EOSINOPHIL % 3 0 - 7 %    BASOPHIL % 1 0 - 3 %    NEUTROPHIL # 2.90 (L) 4.10 - 29.00 x103/uL    LYMPHOCYTE # 1.60 (L) 2.10 - 11.00 x103/uL    MONOCYTE # 0.50 0.00 - 4.10 x103/uL    EOSINOPHIL # 0.20 0.00 - 2.40 x103/uL    BASOPHIL # 0.00 0.00 - 2.50 x103/uL   B-TYPE NATRIURETIC PEPTIDE   Result Value Ref Range    BNP 67 5 - 100 pg/mL   THYROID STIMULATING HORMONE (SENSITIVE TSH)   Result Value Ref Range    TSH 2.499 0.450 - 5.330 uIU/mL   BLUE TOP TUBE   Result Value Ref Range    RAINBOW/EXTRA TUBE AUTO RESULT Yes    GOLD TOP TUBE   Result Value Ref Range    RAINBOW/EXTRA TUBE AUTO RESULT Yes    TROPONIN-I IN ONE HOUR   Result Value Ref Range    TROPONIN I  6 <15 ng/L   TROPONIN-I IN THREE HOURS   Result Value Ref Range    TROPONIN I 5 <15 ng/L     XR AP MOBILE CHEST   Final Result   No acute findings identified.         Radiologist location ID: [id number]

## 2021-07-30 NOTE — ED Nurses Note (Signed)
Patient discharged home with family.  AVS reviewed with patient/care giver.  A written copy of the AVS and discharge instructions was given to the patient/care giver.  Questions sufficiently answered as needed.  Patient/care giver encouraged to follow up with PCP as indicated.  In the event of an emergency, patient/care giver instructed to call 911 or go to the nearest emergency room.

## 2021-07-31 LAB — ECG 12 LEAD
Atrial Rate: 83 {beats}/min
PR Interval: 186 ms
Ventricular rate: 83 {beats}/min

## 2021-08-27 ENCOUNTER — Other Ambulatory Visit (HOSPITAL_COMMUNITY): Payer: Self-pay | Admitting: ORTHOPEDIC, SPORTS MEDICINE

## 2021-08-27 ENCOUNTER — Other Ambulatory Visit: Payer: Self-pay

## 2021-08-27 ENCOUNTER — Inpatient Hospital Stay
Admission: RE | Admit: 2021-08-27 | Discharge: 2021-08-27 | Disposition: A | Discharge status: Home / Self Care | Payer: 59 | Source: Ambulatory Visit | Attending: ORTHOPEDIC, SPORTS MEDICINE | Admitting: ORTHOPEDIC, SPORTS MEDICINE

## 2021-08-27 DIAGNOSIS — M25519 Pain in unspecified shoulder: Secondary | ICD-10-CM

## 2021-09-24 ENCOUNTER — Encounter (INDEPENDENT_AMBULATORY_CARE_PROVIDER_SITE_OTHER): Payer: Self-pay | Admitting: Family

## 2021-09-24 DIAGNOSIS — K219 Gastro-esophageal reflux disease without esophagitis: Secondary | ICD-10-CM | POA: Insufficient documentation

## 2021-09-24 DIAGNOSIS — F32A Depression, unspecified: Secondary | ICD-10-CM

## 2021-09-24 DIAGNOSIS — R32 Unspecified urinary incontinence: Secondary | ICD-10-CM

## 2021-09-24 DIAGNOSIS — I2510 Atherosclerotic heart disease of native coronary artery without angina pectoris: Secondary | ICD-10-CM

## 2021-09-24 DIAGNOSIS — E785 Hyperlipidemia, unspecified: Secondary | ICD-10-CM

## 2021-09-24 DIAGNOSIS — IMO0002 Reserved for concepts with insufficient information to code with codable children: Secondary | ICD-10-CM

## 2021-09-24 DIAGNOSIS — M329 Systemic lupus erythematosus, unspecified: Secondary | ICD-10-CM

## 2021-09-24 DIAGNOSIS — G629 Polyneuropathy, unspecified: Secondary | ICD-10-CM

## 2021-09-24 DIAGNOSIS — I10 Essential (primary) hypertension: Secondary | ICD-10-CM

## 2021-09-24 DIAGNOSIS — E079 Disorder of thyroid, unspecified: Secondary | ICD-10-CM

## 2021-09-24 DIAGNOSIS — I252 Old myocardial infarction: Secondary | ICD-10-CM

## 2021-09-24 DIAGNOSIS — G4730 Sleep apnea, unspecified: Secondary | ICD-10-CM

## 2021-09-24 HISTORY — DX: Atherosclerotic heart disease of native coronary artery without angina pectoris: I25.10

## 2021-09-24 HISTORY — DX: Disorder of thyroid, unspecified: E07.9

## 2021-09-24 HISTORY — DX: Hyperlipidemia, unspecified: E78.5

## 2021-09-24 HISTORY — DX: Unspecified urinary incontinence: R32

## 2021-09-24 HISTORY — DX: Sleep apnea, unspecified: G47.30

## 2021-09-24 HISTORY — DX: Essential (primary) hypertension: I10

## 2021-09-24 HISTORY — DX: Systemic lupus erythematosus, unspecified (CMS HCC): M32.9

## 2021-09-24 HISTORY — DX: Reserved for concepts with insufficient information to code with codable children: IMO0002

## 2021-09-24 HISTORY — DX: Old myocardial infarction: I25.2

## 2021-09-24 HISTORY — DX: Depression, unspecified: F32.A

## 2021-09-24 HISTORY — DX: Polyneuropathy, unspecified: G62.9

## 2021-09-25 ENCOUNTER — Encounter (INDEPENDENT_AMBULATORY_CARE_PROVIDER_SITE_OTHER): Payer: Self-pay | Admitting: Family

## 2021-10-05 IMAGING — MR MRI SHOULDER RT W/O CONTRAST
7 series · 36 of 40 positions shown · IV contrast (gadolinium)
Comparison: None available.

﻿EXAM:  [id number]   MRI SHOULDER RT W/O CONTRAST
INDICATION: Chronic shoulder pain.
TECHNIQUE: Multiplanar multisequential MRI of the right shoulder joint was performed without gadolinium contrast.

[Series 6: T1 · oblique · right · 4.0mm · 0.31mm/px · 5 of 22 slices shown]
[im 1/22]
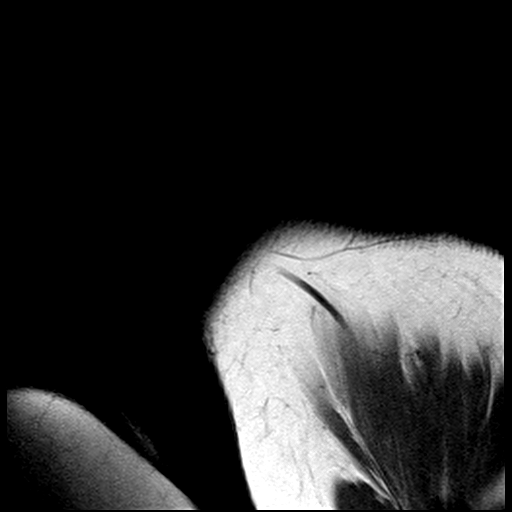
[im 6/22]
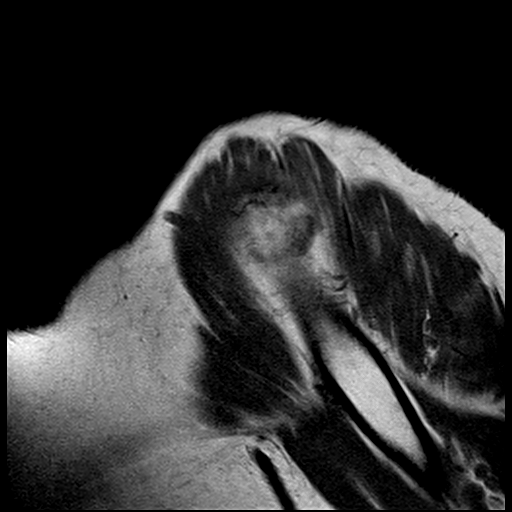
[im 11/22]
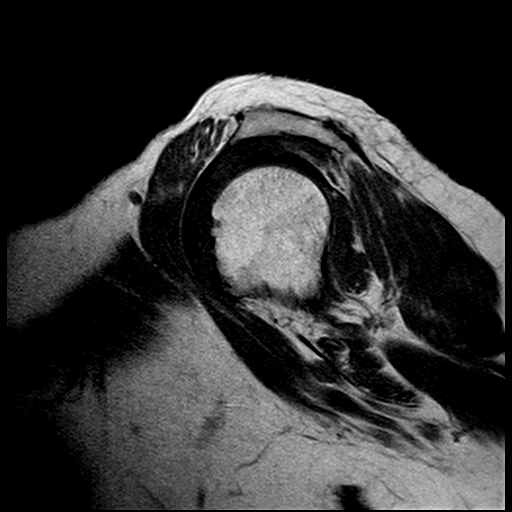
[im 16/22]
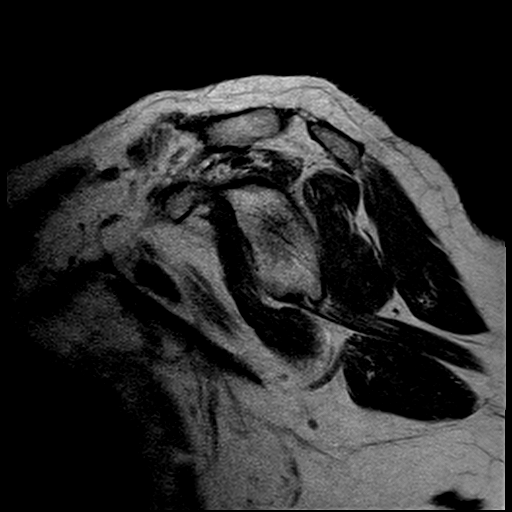
[im 22/22]
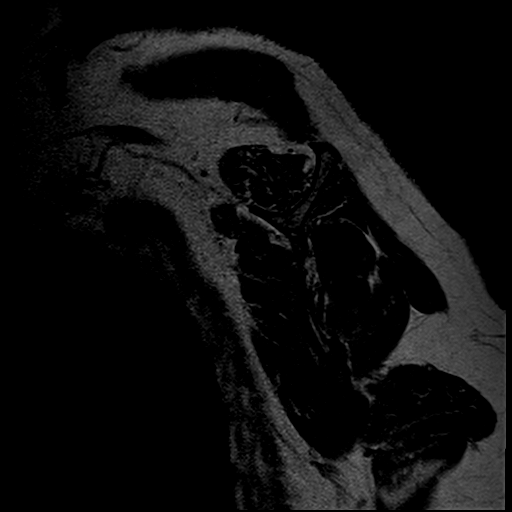

[Series 7: T2 fat-sat · axial · right · 4.0mm · 0.42mm/px · z∈[-34,+69]mm · 5 of 24 slices shown]
[im 1/24]
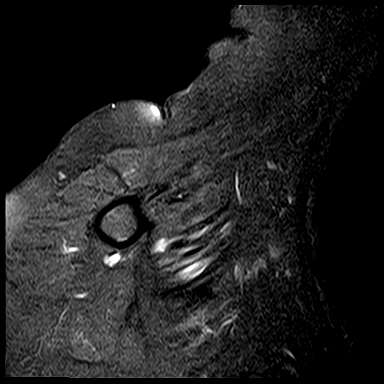
[im 6/24]
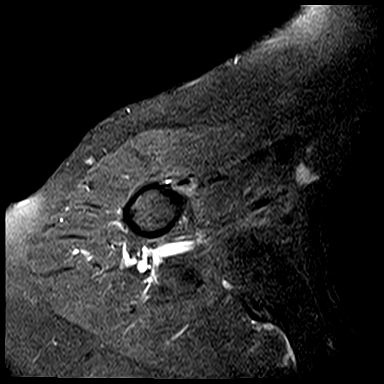
[im 12/24]
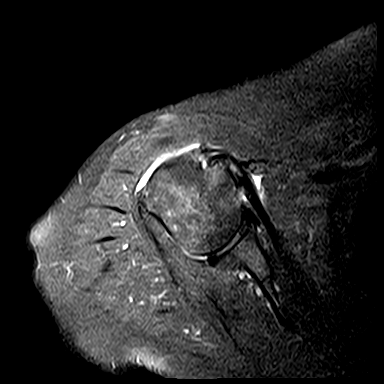
[im 18/24]
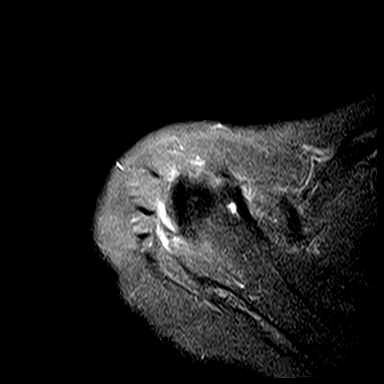
[im 24/24]
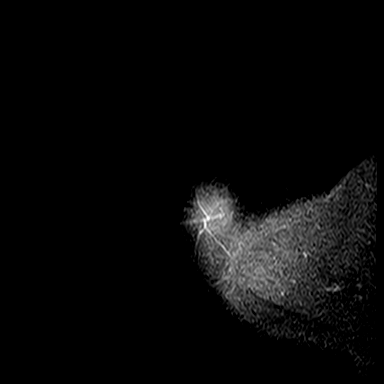

[Series 8: T2 · oblique · right · 4.0mm · 0.42mm/px · 6 of 22 slices shown]
[im 1/22]
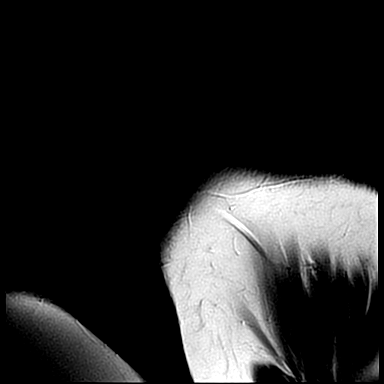
[im 5/22]
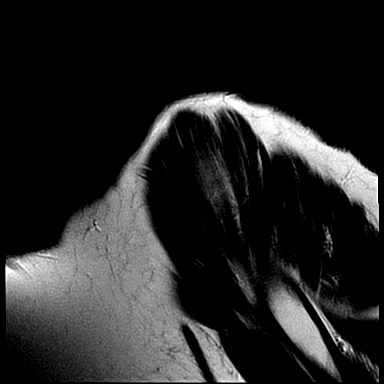
[im 9/22]
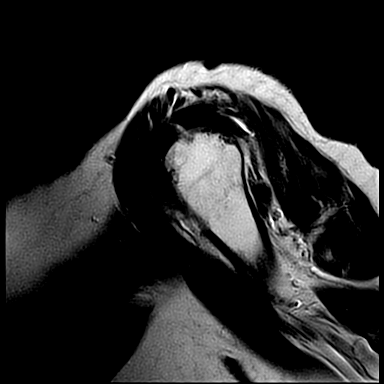
[im 13/22]
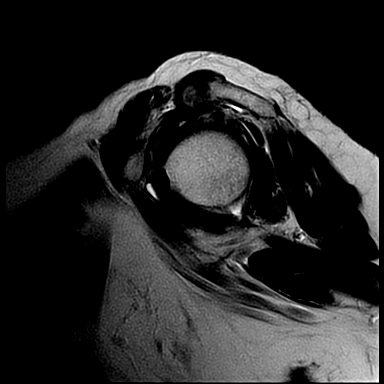
[im 17/22]
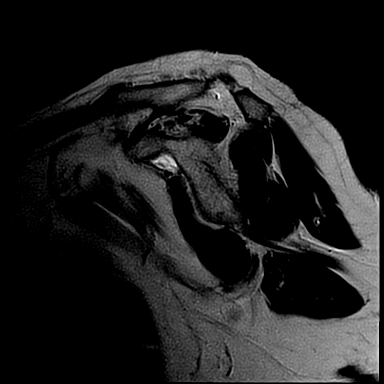
[im 22/22]
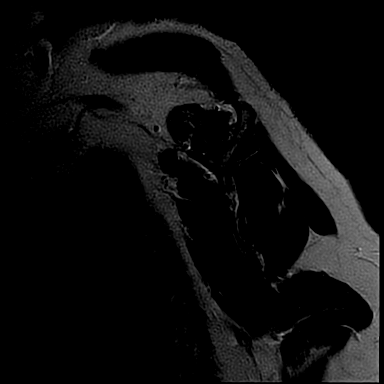

[Series 11: STIR · oblique · right · 4.0mm · 0.50mm/px · 6 of 22 slices shown (1 of 2)]
[im 1/22]
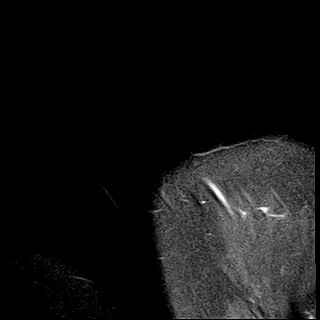
[im 5/22]
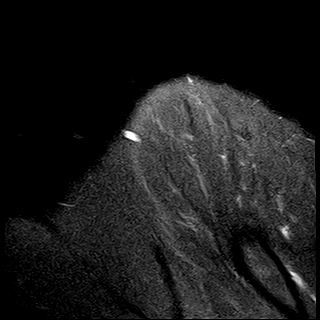
[im 9/22]
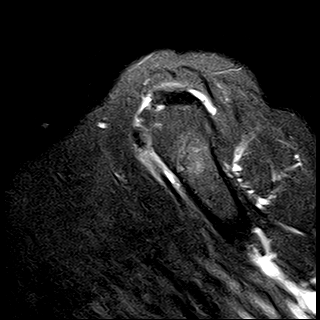
[im 13/22]
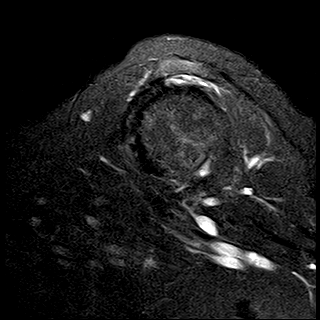
[im 17/22]
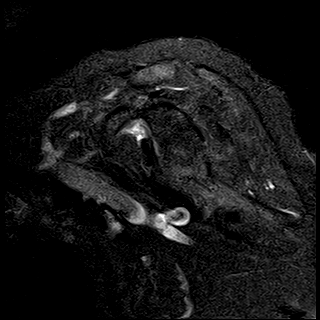
[im 22/22]
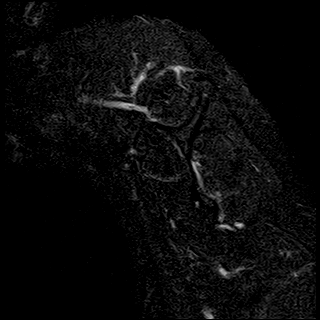

[Series 12: STIR · oblique · right · 4.0mm · 0.47mm/px · 2 of 23 slices shown (2 of 2)]
[im 1/23]
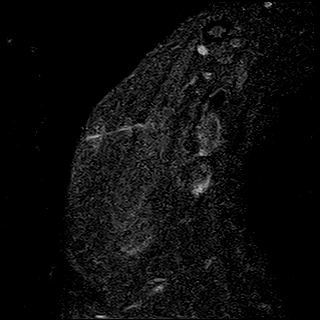
[im 5/23]
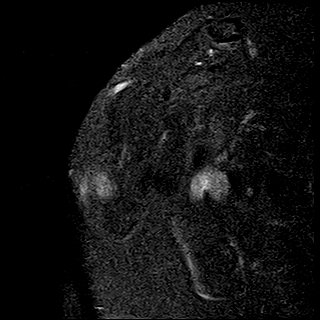

[Series 14: PD fat-sat · oblique · right · 4.0mm · 0.47mm/px · 6 of 23 slices shown]
[im 1/23]
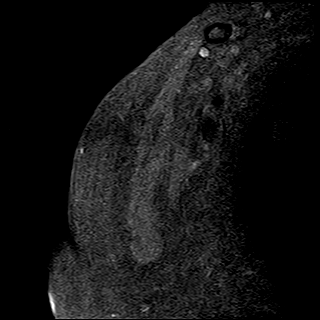
[im 5/23]
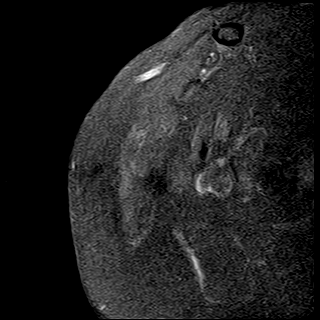
[im 9/23]
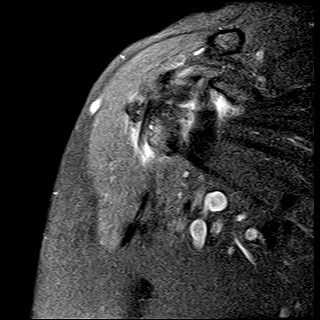
[im 14/23]
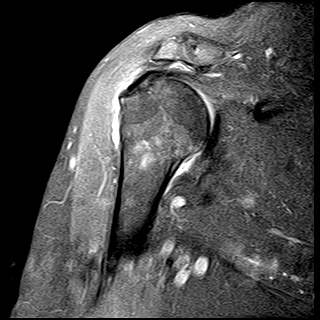
[im 18/23]
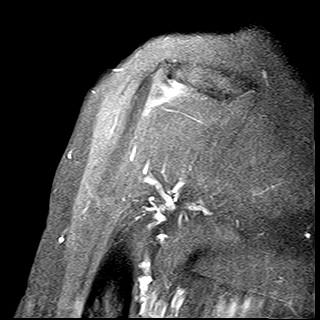
[im 23/23]
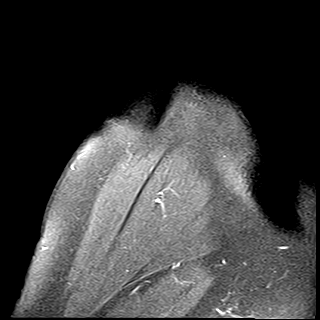

[Series 15: PD · axial · right · 4.0mm · 0.50mm/px · z∈[-34,+69]mm · 6 of 24 slices shown]
[im 1/24]
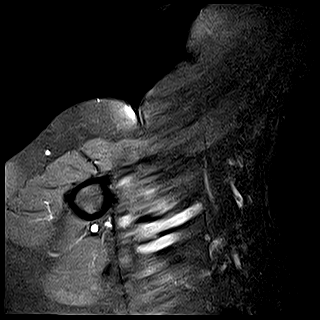
[im 5/24]
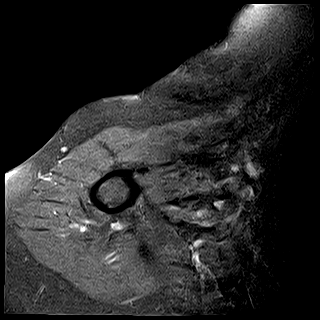
[im 10/24]
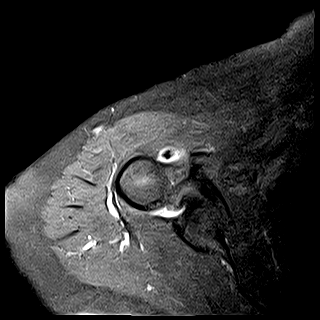
[im 14/24]
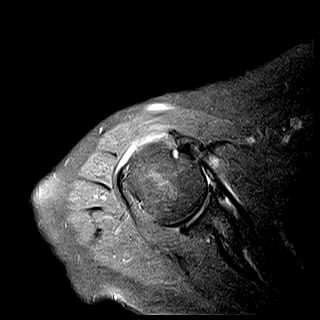
[im 19/24]
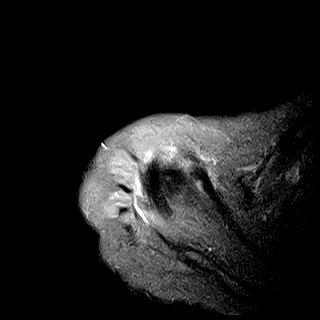
[im 24/24]
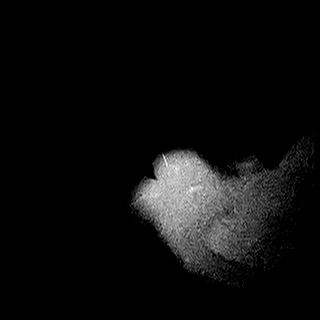

[36 of 40 positions shown; findings below may reference images not displayed]

FINDINGS: There is a full-thickness supraspinatus tendon tear. Infraspinatus, teres minor and subscapularis muscles and tendons are within normal limits in morphology and signal intensity. Long head of biceps tendon is well seated within the intertubercular groove and attaches normally to the biceps anchor. Superior labrum appears intact. Glenohumeral articular cartilage is well maintained. There is moderate acromioclavicular joint osteoarthritis. Small amount of fluid is seen within the subacromial/subdeltoid bursa. Muscle bulk and bone marrow signal intensity are normal. No mass is seen along the course of the suprascapular nerve, within the spinoglenoid notch or within the quadrilateral space.
IMPRESSION: 1. Full-thickness supraspinatus tendon tear. 

2. Moderate acromioclavicular joint osteoarthritis.

## 2021-10-15 ENCOUNTER — Other Ambulatory Visit (HOSPITAL_COMMUNITY): Payer: Self-pay | Admitting: FAMILY PRACTICE

## 2021-10-15 DIAGNOSIS — I10 Essential (primary) hypertension: Secondary | ICD-10-CM

## 2021-10-15 DIAGNOSIS — R1011 Right upper quadrant pain: Secondary | ICD-10-CM

## 2021-10-26 ENCOUNTER — Ambulatory Visit (HOSPITAL_COMMUNITY): Payer: Self-pay

## 2021-11-01 ENCOUNTER — Ambulatory Visit (HOSPITAL_COMMUNITY): Payer: Self-pay

## 2021-11-12 ENCOUNTER — Other Ambulatory Visit: Payer: Self-pay

## 2021-11-12 ENCOUNTER — Inpatient Hospital Stay
Admission: RE | Admit: 2021-11-12 | Discharge: 2021-11-12 | Disposition: A | Discharge status: Home / Self Care | Payer: Medicare Other | Source: Ambulatory Visit | Attending: FAMILY PRACTICE | Admitting: FAMILY PRACTICE

## 2021-11-12 DIAGNOSIS — I10 Essential (primary) hypertension: Secondary | ICD-10-CM | POA: Insufficient documentation

## 2021-11-29 IMAGING — US US ABDOMEN COMPLETE
1 series · 14 of 25 positions shown · non-contrast
Comparison: Abdominal sonogram dated 11/23/2018.

﻿EXAM: US ABDOMEN COMPLETE
INDICATION: Right upper quadrant pain.

[Series 1: us abdomen complete · 14 of 50 slices shown]
[im 1/50]
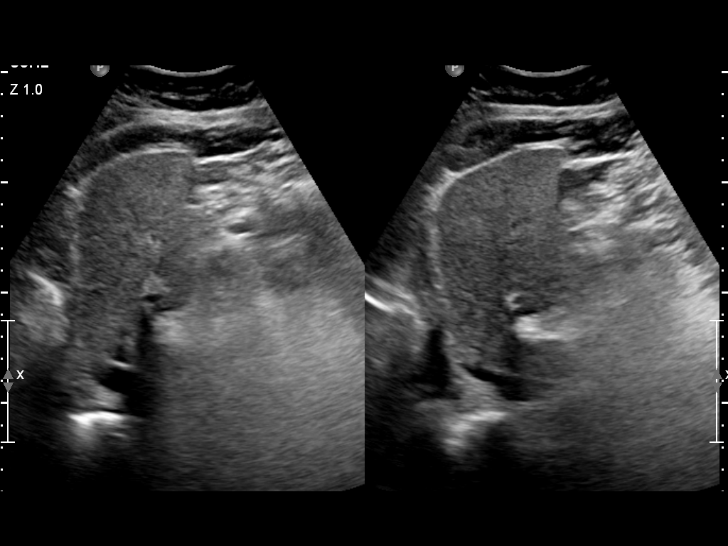
[im 5/50]
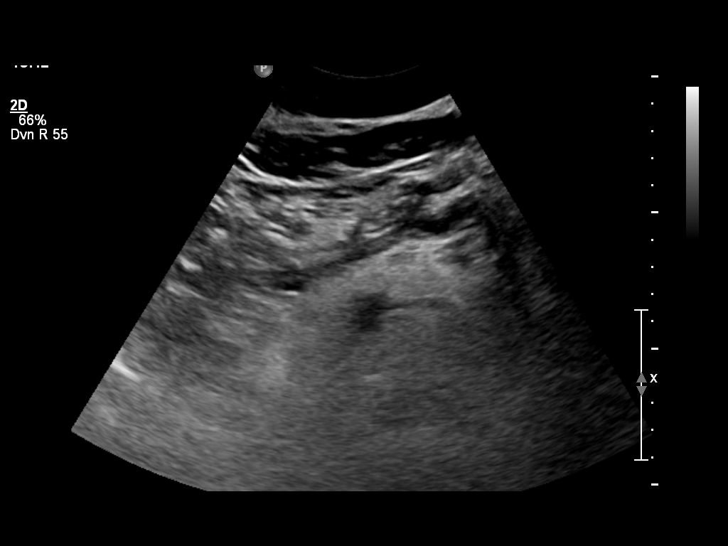
[im 9/50]
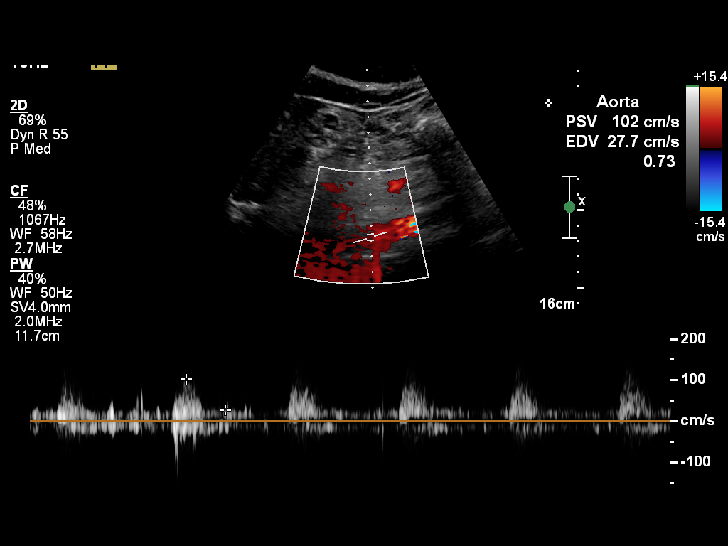
[im 13/50]
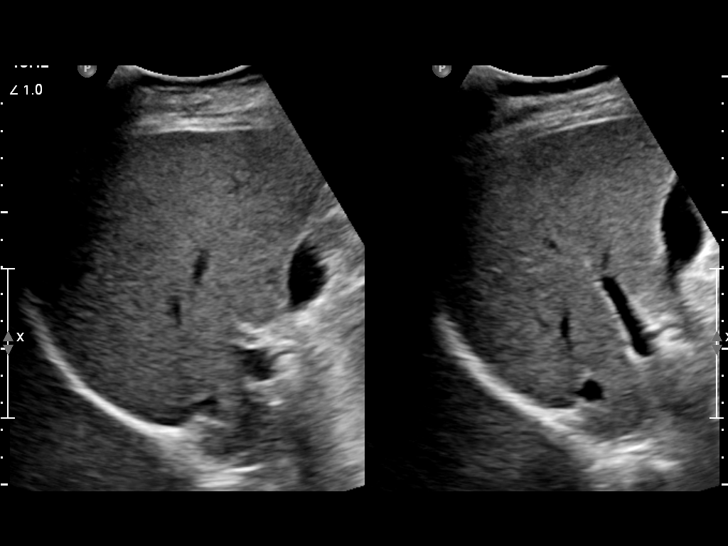
[im 17/50]
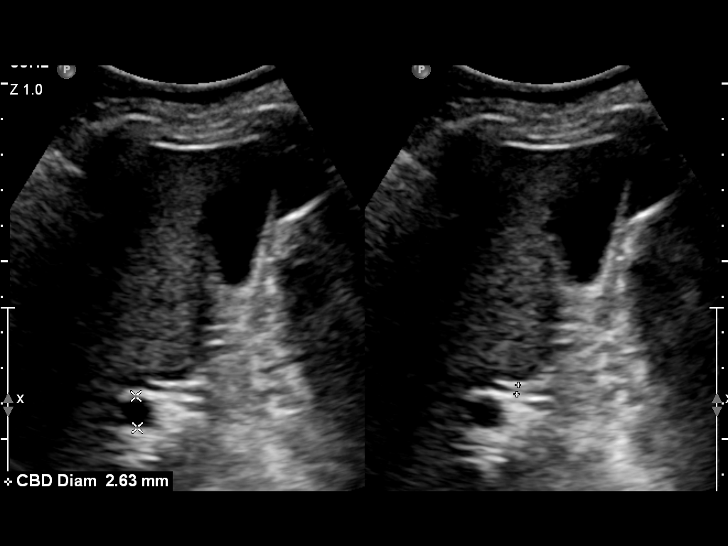
[im 19/50]
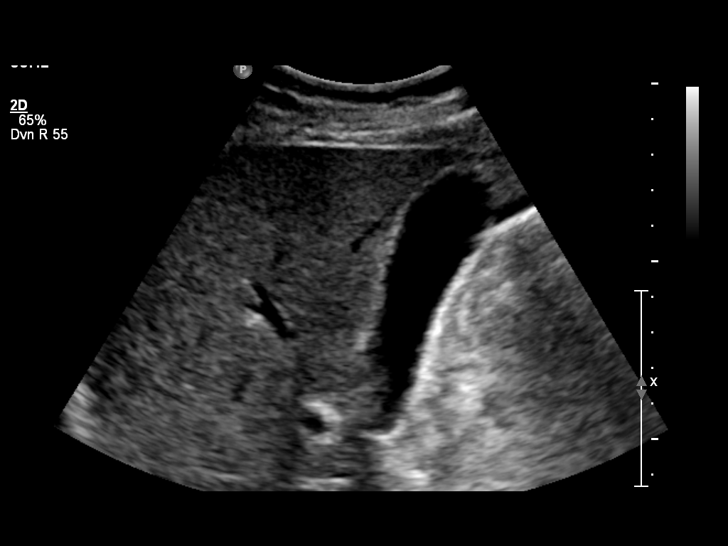
[im 23/50]
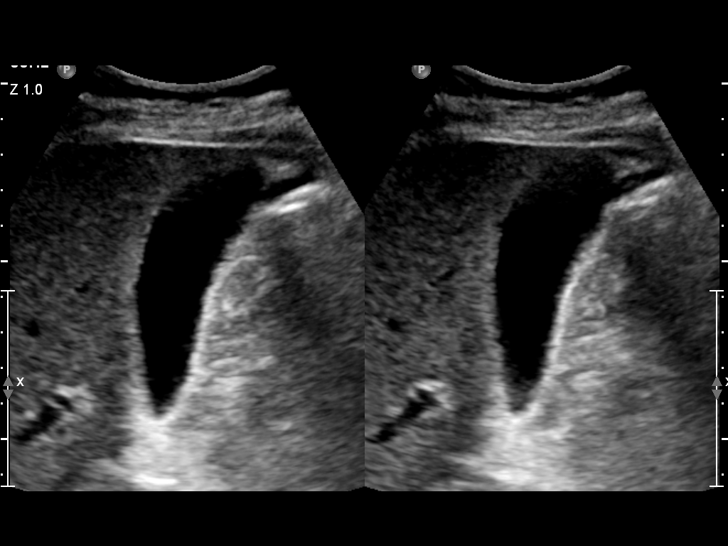
[im 27/50]
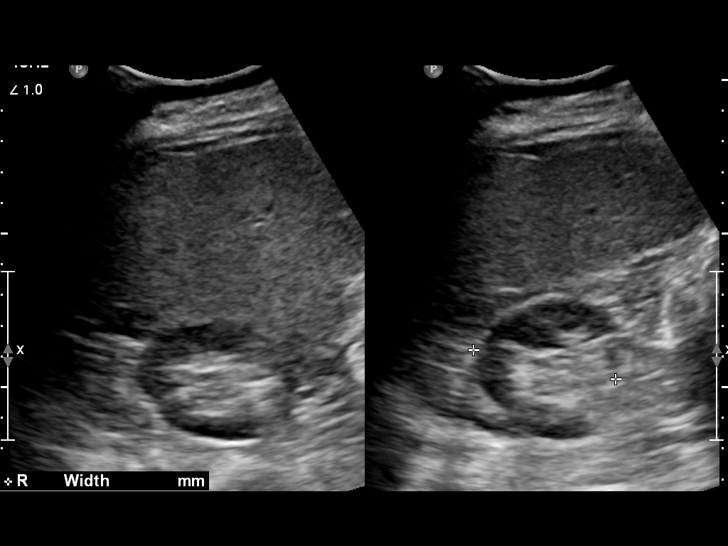
[im 31/50]
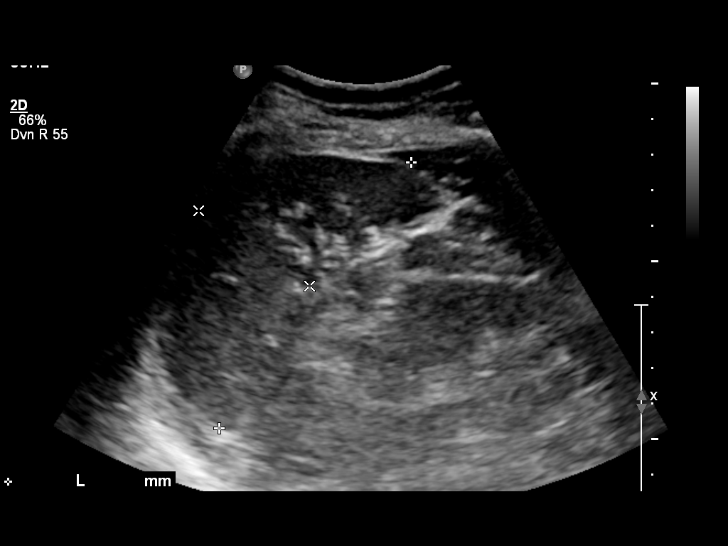
[im 33/50]
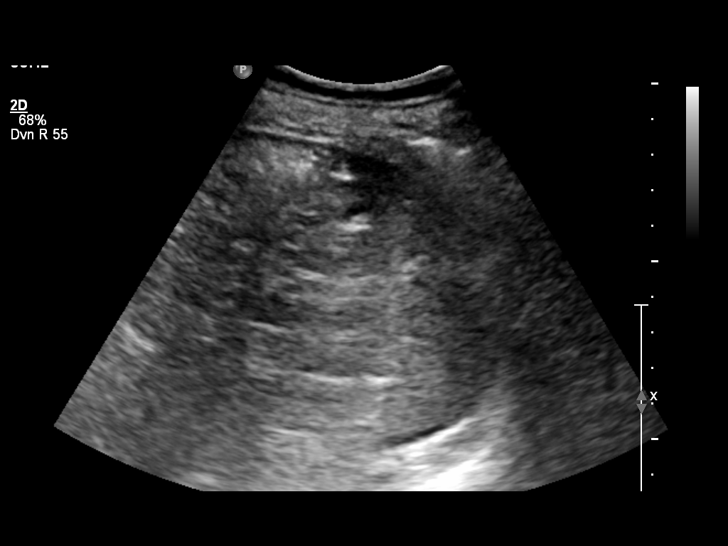
[im 37/50]
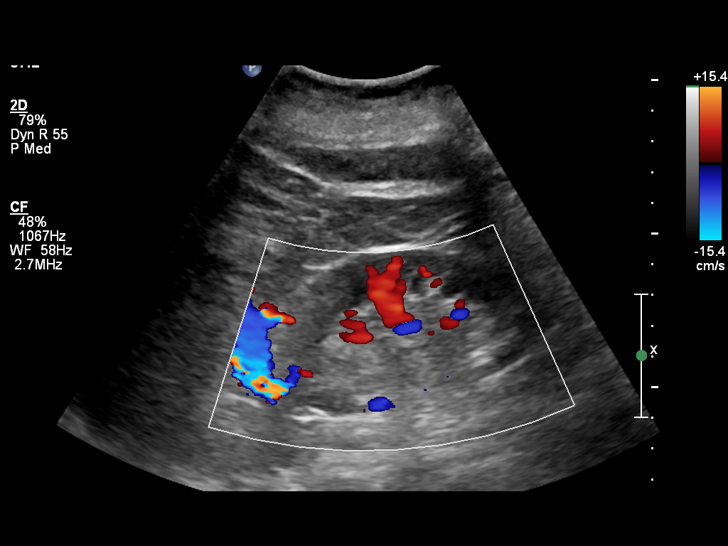
[im 41/50]
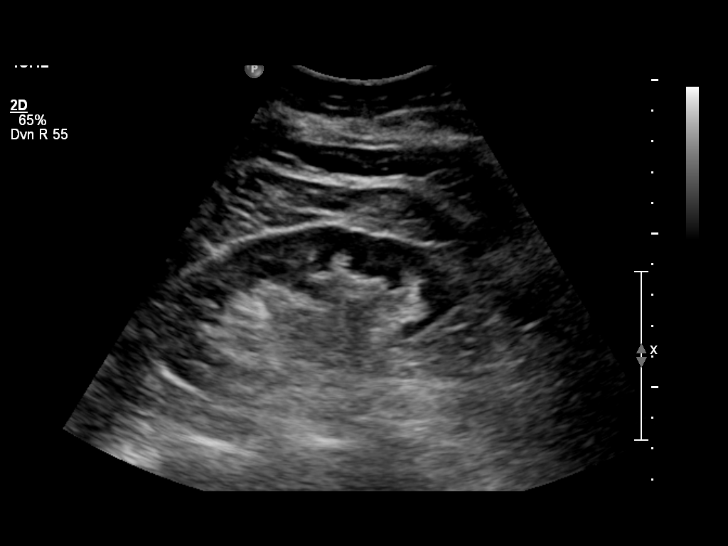
[im 45/50]
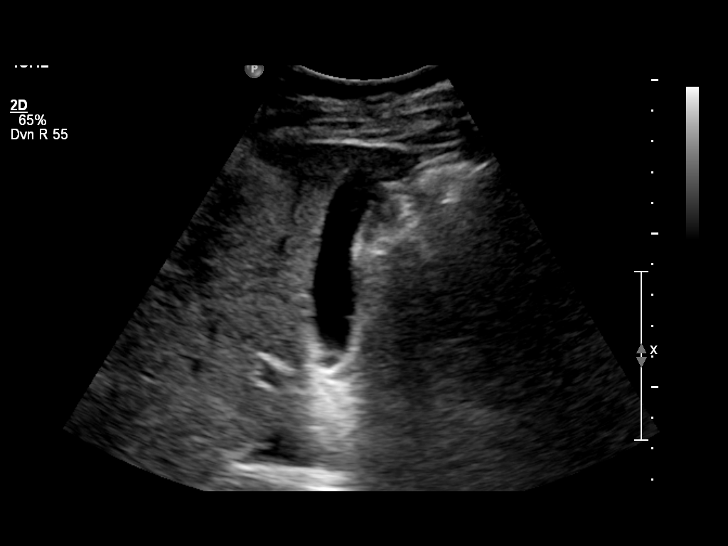
[im 50/50]
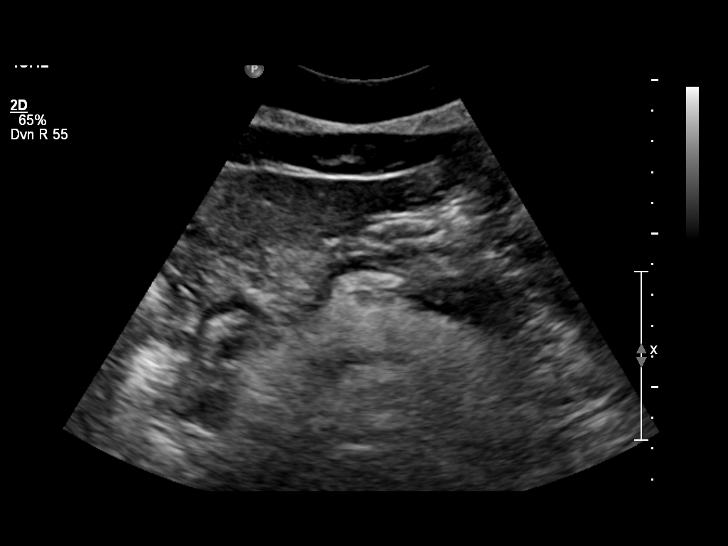

[14 of 25 positions shown; findings below may reference images not displayed]

FINDINGS: Liver is normal in echogenicity. There is no hepatic mass. There is no intra or extrahepatic biliary ductal dilation. Common bile duct measures 3 mm. No sludge or shadowing gallstones are seen. There is no gallbladder wall thickening or pericholecystic fluid. Pancreas is incompletely visualized due to artifact from overlying bowel gas. Spleen measures 9 cm and is unremarkable.

Kidneys are normal in echogenicity. Right kidney measures 11 cm and left kidney measures 9.5 cm. There is no hydronephrosis, mass or cyst on either side.

Visualized abdominal aorta is without aneurysmal dilatation. IVC is normal. There is no ascites.
IMPRESSION: 1. Unremarkable liver.

2. No evidence of cholelithiasis or acute cholecystitis.

3. Pancreas incompletely visualized due to artifact from overlying bowel gas.

## 2021-12-24 ENCOUNTER — Other Ambulatory Visit (HOSPITAL_COMMUNITY): Payer: Self-pay | Admitting: FAMILY PRACTICE

## 2021-12-24 DIAGNOSIS — Z780 Asymptomatic menopausal state: Secondary | ICD-10-CM

## 2021-12-24 DIAGNOSIS — Z13820 Encounter for screening for osteoporosis: Secondary | ICD-10-CM

## 2022-01-01 ENCOUNTER — Ambulatory Visit (HOSPITAL_COMMUNITY): Payer: Self-pay

## 2022-01-11 ENCOUNTER — Ambulatory Visit (HOSPITAL_COMMUNITY): Payer: Medicare HMO

## 2022-03-07 ENCOUNTER — Other Ambulatory Visit (HOSPITAL_COMMUNITY): Payer: 59

## 2022-04-28 ENCOUNTER — Encounter (HOSPITAL_COMMUNITY): Payer: Self-pay | Admitting: Emergency Medicine

## 2022-04-28 ENCOUNTER — Other Ambulatory Visit: Payer: Self-pay

## 2022-04-28 ENCOUNTER — Emergency Department
Admission: EM | Admit: 2022-04-28 | Discharge: 2022-04-29 | Disposition: A | Discharge status: Home / Self Care | Payer: Medicare HMO | Attending: Emergency Medicine | Admitting: Emergency Medicine

## 2022-04-28 ENCOUNTER — Emergency Department (HOSPITAL_COMMUNITY): Payer: Medicare HMO

## 2022-04-28 DIAGNOSIS — I10 Essential (primary) hypertension: Secondary | ICD-10-CM | POA: Insufficient documentation

## 2022-04-28 DIAGNOSIS — R0789 Other chest pain: Secondary | ICD-10-CM

## 2022-04-28 DIAGNOSIS — R079 Chest pain, unspecified: Secondary | ICD-10-CM | POA: Insufficient documentation

## 2022-04-28 DIAGNOSIS — I252 Old myocardial infarction: Secondary | ICD-10-CM | POA: Insufficient documentation

## 2022-04-28 LAB — LACTIC ACID LEVEL W/ REFLEX FOR LEVEL >2.0: LACTIC ACID: 1.7 mmol/L (ref 0.5–2.2)

## 2022-04-28 LAB — COMPREHENSIVE METABOLIC PANEL, NON-FASTING
ALBUMIN/GLOBULIN RATIO: 1.3 (ref 0.8–1.4)
ALBUMIN: 4.1 g/dL (ref 3.5–5.7)
ALKALINE PHOSPHATASE: 83 U/L (ref 34–104)
ALT (SGPT): 23 U/L (ref 7–52)
ANION GAP: 11 mmol/L (ref 4–13)
AST (SGOT): 23 U/L (ref 13–39)
BILIRUBIN TOTAL: 0.3 mg/dL (ref 0.3–1.2)
BUN/CREA RATIO: 23 — ABNORMAL HIGH (ref 6–22)
BUN: 22 mg/dL (ref 7–25)
CALCIUM, CORRECTED: 9.3 mg/dL (ref 8.9–10.8)
CALCIUM: 9.4 mg/dL (ref 8.6–10.3)
CHLORIDE: 106 mmol/L (ref 98–107)
CO2 TOTAL: 23 mmol/L (ref 21–31)
CREATININE: 0.96 mg/dL (ref 0.60–1.30)
ESTIMATED GFR: 63 mL/min/{1.73_m2} (ref >59)
GLOBULIN: 3.1 (ref 2.9–5.4)
GLUCOSE: 115 mg/dL — ABNORMAL HIGH (ref 74–109)
OSMOLALITY, CALCULATED: 284 mOsm/kg (ref 270–290)
POTASSIUM: 3.5 mmol/L (ref 3.5–5.1)
PROTEIN TOTAL: 7.2 g/dL (ref 6.4–8.9)
SODIUM: 140 mmol/L (ref 136–145)

## 2022-04-28 LAB — CBC WITH DIFF
BASOPHIL #: 0.1 10*3/uL (ref 0.00–0.10)
BASOPHIL %: 1 % (ref 0–1)
EOSINOPHIL #: 0.2 10*3/uL (ref 0.00–0.50)
EOSINOPHIL %: 2 %
HCT: 39.5 % (ref 31.2–41.9)
HGB: 13.8 g/dL (ref 10.9–14.3)
LYMPHOCYTE #: 2.4 10*3/uL (ref 1.00–3.00)
LYMPHOCYTE %: 32 % (ref 16–44)
MCH: 33.8 pg — ABNORMAL HIGH (ref 24.7–32.8)
MCHC: 35 g/dL (ref 32.3–35.6)
MCV: 96.3 fL — ABNORMAL HIGH (ref 75.5–95.3)
MONOCYTE #: 0.6 10*3/uL (ref 0.30–1.00)
MONOCYTE %: 8 % (ref 5–13)
MPV: 7.1 fL — ABNORMAL LOW (ref 7.9–10.8)
NEUTROPHIL #: 4.2 10*3/uL (ref 1.85–7.80)
NEUTROPHIL %: 57 % (ref 43–77)
PLATELETS: 243 10*3/uL (ref 140–440)
RBC: 4.1 10*6/uL (ref 3.63–4.92)
RDW: 14 % (ref 12.3–17.7)
WBC: 7.4 10*3/uL (ref 3.8–11.8)

## 2022-04-28 LAB — PT/INR
INR: 1.05 (ref 0.84–1.10)
PROTHROMBIN TIME: 12.2 seconds (ref 9.8–12.7)

## 2022-04-28 LAB — PTT (PARTIAL THROMBOPLASTIN TIME): APTT: 32.9 seconds (ref 25.0–38.0)

## 2022-04-28 LAB — MAGNESIUM: MAGNESIUM: 1.9 mg/dL (ref 1.9–2.7)

## 2022-04-28 LAB — TROPONIN-I
TROPONIN I: 4 ng/L (ref <15)
TROPONIN I: 4 ng/L (ref <15)
TROPONIN I: 3 ng/L (ref <15)

## 2022-04-28 MED ORDER — ASPIRIN 81 MG CHEWABLE TABLET
324.0000 mg | CHEWABLE_TABLET | ORAL | Status: AC
Start: 2022-04-28 — End: 2022-04-28
  Administered 2022-04-28: 324 mg via ORAL

## 2022-04-28 MED ORDER — ASPIRIN 81 MG CHEWABLE TABLET
CHEWABLE_TABLET | ORAL | Status: AC
Start: 2022-04-28 — End: 2022-04-28
  Filled 2022-04-28: qty 4

## 2022-04-28 NOTE — ED Nurses Note (Signed)
Reports sharp chest pain on 12/23, 10/10 pain. Pain returned this evening 2/10. Reports wanting to come in to be sure d/t hx of MI. Denies nausea, sob, radiating pain at this time. Reports chronic dizziness. Unsure of home medications but reports no blood thinners at this time. Call light within reach.

## 2022-04-28 NOTE — ED Provider Notes (Signed)
[name]  ED Primary Provider Note        Arrival: The patient arrived by Car     History of Present Illness   chief complaint  Judy Hicks is a 71 y.o. female who had concerns including Chest Pain .  This is 71 year old female presents emergency room with a chief complaint of sharp anterior chest pain which started yesterday evening.  She states it was "a 20/10".  It was currently a 2/10.  Pain is worse with movement and deep breath.  Again she said it was very sharp in nature "like somebody stabbing with a knife".  She states that she took 4 ibuprofen and 2 sublingual nitroglycerin without any relief.  She ended up eventually going asleep yesterday evening and woke up this morning with only some slight pain.  It has abated down to a 2/10 right now.  Patient also states that she had a fall 1 week ago and she has tenderness in the right lateral chest at approximately the 3rd and 4th ribs.  Patient reports she had a prior history of MI in 9.  No stenting.  She is followed by Dr. Conley Canal.  She reports she does have hypertension no diabetes, no hyperlipidemia.  No leg or calf tenderness.  All nursing notes reviewed        Review of Systems     No other overt Review of Systems are noted to be positive except noted in the HPI.    { Be sure to review and modify ROS as appropriate. As of May 06, 2021 you are only required to have a "medically appropriate" ROS and PE for billing purposes. This help text will disappear when signing your note.:123}  Historical Data   History Reviewed This Encounter:  Past medical/surgical/family/social history reviewed    Physical Exam   ED Triage Vitals [04/28/22 1838]   BP (Non-Invasive) 139/86   Heart Rate 87   Respiratory Rate 20   Temperature 36.8 C (98.3 F)   SpO2 97 %   Weight 95.3 kg (210 lb)   Height 1.676 m (5\' 6" )     {Be sure to review and modify Physical Exam as appropriate. As of May 06, 2021 you are only required to have a "medically  appropriate" ROS and PE for billing purposes. This help text will disappear when signing your note.:123}    Exam:   Constitutional:  Patient alert orient x3 in no apparent distress.  No limitations.  Head: Atraumatic normocephalic  Eyes :  Pupils are equal round reactive to light and accommodation extraocular muscles are intact.  Sclera and conjunctiva are unremarkable  Ears:  Tympanic membranes are pearly gray bilaterally; external auditory canals are unremarkable; external ears without any lesions  Nose:  Nares are patent turbinates are pink and moist  Mouth:  Mucosa is pink and moist without lesions.  Posterior pharynx is pink and moist without hypertrophy/exudate.  Chest:  Patient is tender to palpation over the left lateral chest just beneath the breasts.  Patient states this is tenderness from fall for 1 week ago.  This is not where she has been experiencing substernal/anterior wall chest pain.  Neck:  Soft and supple without palpable lymphadenopathy.  Heart:  Regular rate and rhythm without audible murmur  Lungs:  Clear to auscultation bilaterally without any wheezing/rales/rhonchi  Abdomen:  Soft nontender without any rebound or guarding; positive bowel sounds throughout  Genitalia:  Deferred  Skin:  Warm and dry  without lesions.  Normal skin turgor.  Brisk capillary refill distally  Extremities:  Good strength bilaterally with full range of motion of upper and lower extremities.  No leg or calf tenderness  Neuro:  Alert oriented x3.  Cranial nerves II-XII grossly intact as tested.  Excellent sensation distally over all dermatomes.  Psychiatric:  Patient cooperative, affect appropriate, insight and judgment good          Procedures      Patient Data   {Click here to open the ED Workup Activity for clinical data review *This link will automatically disappear upon signing your note*:123}  Labs Ordered/Reviewed   CBC WITH DIFF - Abnormal; Notable for the following components:       Result Value    MCV 96.3 (*)      MCH 33.8 (*)     MPV 7.1 (*)     All other components within normal limits   CBC/DIFF    Narrative:     The following orders were created for panel order CBC/DIFF.  Procedure                               Abnormality         Status                     ---------                               -----------         ------                     CBC WITH DN:[id number]                Abnormal            Final result                 Please view results for these tests on the individual orders.   COMPREHENSIVE METABOLIC PANEL, NON-FASTING   TROPONIN-I   TROPONIN-I   TROPONIN-I   MAGNESIUM   PT/INR   PTT (PARTIAL THROMBOPLASTIN TIME)   LACTIC ACID LEVEL W/ REFLEX FOR LEVEL >2.0       No orders to display       Medical Decision Making     ED clinical impression missing, please click on the following link to add Clinical Impressions ***  then refresh the note prior to signing.  {Be sure to fill out the MDM SmartBlock in Notewriter to the left. Do not modify this italicized text, it will disappear upon signing your note:123}  Medical Decision Making      ED Course as of 04/28/22 2131   Sun Apr 28, 2022   1918 EKG shows normal sinus rhythm with a heart rate of 92, normal axis, normal R-wave progression, no ectopy, no ischemia, normal PR/QRS interval.   1938 CBC unremarkable   1939 Lactic acid unremarkable   1939 PT/INR/PTT are all within normal range   2105 CMP is unremarkable   2105 Initial troponin was 4, troponin after 1 hour is 4   2105 Lactic acid 1.7   2108 Patient rechecked at this time she continues to remain pain-free.   2131 Single portable view chest shows no acute infiltrate, no acute disease process         Medications Administered  in the ED   aspirin chewable tablet 324 mg (has no administration in time range)       {Disposition:38159}    {Critical Care Time (Optional):37527}           ED clinical impression missing, please click on the following link to add Clinical Impressions ***  then refresh the note prior to  signing.      Current Discharge Medication List          R.A. DeWitt, DO  Department of Emergency Medicine              {Remember to refresh your note prior to signing. Use Control + [id number] or click the refresh button at the bottom of the note. This reminder text will automatically disappear when you sign your note.:123}

## 2022-04-28 NOTE — ED Triage Notes (Addendum)
Chest pain since last night, denies radiation of pain. Fall x1 week ago injuring the right side. Took 1 nitro last night at 10:30 PM

## 2022-04-28 NOTE — ED Nurses Note (Signed)
Patient received to room, placed on monitor including BP, O2 & cardiac. Assessment and history completed. Call light in reach, encouraged to call for any needs.

## 2022-04-29 LAB — GOLD TOP TUBE

## 2022-04-29 NOTE — ED Nurses Note (Signed)
Pt was given discharge paperwork.  IV was taken out and intact.  Questions and concerns were addressed at this time.  Pt left department via ambulation.

## 2022-04-29 NOTE — Discharge Instructions (Signed)
Take all regular medications as prescribed  Follow-up with Dr. Viann Shove for recheck this week  Return to emergency room for any recurrence of chest pain, shortness of breath, nausea, headache, or any concerns

## 2022-04-30 DIAGNOSIS — R079 Chest pain, unspecified: Secondary | ICD-10-CM

## 2022-04-30 LAB — ECG 12 LEAD
Atrial Rate: 92 {beats}/min
Calculated P Axis: 15 degrees
Calculated R Axis: 19 degrees
Calculated T Axis: 48 degrees
PR Interval: 150 ms
QRS Duration: 78 ms
QT Interval: 370 ms
QTC Calculation: 457 ms
Ventricular rate: 92 {beats}/min

## 2022-05-06 ENCOUNTER — Emergency Department
Admission: EM | Admit: 2022-05-06 | Discharge: 2022-05-06 | Disposition: A | Discharge status: Home / Self Care | Payer: 59 | Attending: Family | Admitting: Family

## 2022-05-06 ENCOUNTER — Other Ambulatory Visit: Payer: Self-pay

## 2022-05-06 ENCOUNTER — Emergency Department (HOSPITAL_BASED_OUTPATIENT_CLINIC_OR_DEPARTMENT_OTHER): Payer: 59

## 2022-05-06 ENCOUNTER — Encounter (HOSPITAL_BASED_OUTPATIENT_CLINIC_OR_DEPARTMENT_OTHER): Payer: Self-pay

## 2022-05-06 DIAGNOSIS — R42 Dizziness and giddiness: Secondary | ICD-10-CM | POA: Insufficient documentation

## 2022-05-06 DIAGNOSIS — Z1152 Encounter for screening for COVID-19: Secondary | ICD-10-CM | POA: Insufficient documentation

## 2022-05-06 DIAGNOSIS — J069 Acute upper respiratory infection, unspecified: Secondary | ICD-10-CM | POA: Insufficient documentation

## 2022-05-06 LAB — COMPREHENSIVE METABOLIC PANEL, NON-FASTING
ALBUMIN/GLOBULIN RATIO: 0.9 (ref 0.8–1.4)
ALBUMIN: 3.6 g/dL (ref 3.4–5.0)
ALKALINE PHOSPHATASE: 95 U/L (ref 46–116)
ALT (SGPT): 27 U/L (ref <=78)
ANION GAP: 8 mmol/L (ref 4–13)
AST (SGOT): 24 U/L (ref 15–37)
BILIRUBIN TOTAL: 0.3 mg/dL (ref 0.2–1.0)
BUN/CREA RATIO: 18
BUN: 19 mg/dL — ABNORMAL HIGH (ref 7–18)
CALCIUM, CORRECTED: 9.3 mg/dL
CALCIUM: 9 mg/dL (ref 8.5–10.1)
CHLORIDE: 102 mmol/L (ref 98–107)
CO2 TOTAL: 29 mmol/L (ref 21–32)
CREATININE: 1.04 mg/dL — ABNORMAL HIGH (ref 0.55–1.02)
ESTIMATED GFR: 57 mL/min/{1.73_m2} — ABNORMAL LOW (ref >59)
GLOBULIN: 3.9
GLUCOSE: 118 mg/dL — ABNORMAL HIGH (ref 74–106)
OSMOLALITY, CALCULATED: 281 mOsm/kg (ref 270–290)
POTASSIUM: 4 mmol/L (ref 3.5–5.1)
PROTEIN TOTAL: 7.5 g/dL (ref 6.4–8.2)
SODIUM: 139 mmol/L (ref 136–145)

## 2022-05-06 LAB — COVID-19, FLU A/B, RSV RAPID BY PCR
INFLUENZA VIRUS TYPE A: NOT DETECTED
INFLUENZA VIRUS TYPE B: NOT DETECTED
RESPIRATORY SYNCTIAL VIRUS (RSV): NOT DETECTED
SARS-CoV-2: NOT DETECTED

## 2022-05-06 LAB — CBC WITH DIFF
BASOPHIL #: 0.01 10*3/uL (ref 0.00–0.30)
BASOPHIL %: 0 % (ref 0–3)
EOSINOPHIL #: 0.16 10*3/uL (ref 0.00–0.80)
EOSINOPHIL %: 3 % (ref 0–7)
HCT: 42.3 % (ref 37.0–47.0)
HGB: 14.5 g/dL (ref 12.5–16.0)
LYMPHOCYTE #: 2 10*3/uL (ref 1.10–5.00)
LYMPHOCYTE %: 36 % (ref 25–45)
MCH: 33.7 pg — ABNORMAL HIGH (ref 27.0–32.0)
MCHC: 34.2 g/dL (ref 32.0–36.0)
MCV: 98.7 fL (ref 78.0–99.0)
MONOCYTE #: 0.5 10*3/uL (ref 0.00–1.30)
MONOCYTE %: 9 % (ref 0–12)
MPV: 7.3 fL — ABNORMAL LOW (ref 7.4–10.4)
NEUTROPHIL #: 2.85 10*3/uL (ref 1.80–8.40)
NEUTROPHIL %: 52 % (ref 40–76)
PLATELETS: 271 10*3/uL (ref 140–440)
RBC: 4.29 10*6/uL (ref 4.20–5.40)
RDW: 14.6 % (ref 11.6–14.8)
WBC: 5.5 10*3/uL (ref 4.0–10.5)

## 2022-05-06 LAB — RAPID THROAT SCREEN, STREPTOCOCCUS, WITH REFLEX: THROAT RAPID SCREEN, STREPTOCOCCUS: NEGATIVE

## 2022-05-06 LAB — MAGNESIUM: MAGNESIUM: 1.8 mg/dL (ref 1.8–2.4)

## 2022-05-06 MED ORDER — MECLIZINE 25 MG TABLET
25.0000 mg | ORAL_TABLET | ORAL | Status: AC
Start: 2022-05-06 — End: 2022-05-06
  Administered 2022-05-06: 25 mg via ORAL

## 2022-05-06 MED ORDER — DOXYCYCLINE HYCLATE 100 MG CAPSULE
100.0000 mg | ORAL_CAPSULE | Freq: Two times a day (BID) | ORAL | 0 refills | Status: AC
Start: 2022-05-06 — End: 2022-05-16

## 2022-05-06 MED ORDER — PREDNISONE 20 MG TABLET
20.0000 mg | ORAL_TABLET | Freq: Every day | ORAL | 0 refills | Status: AC
Start: 2022-05-06 — End: 2022-05-11

## 2022-05-06 MED ORDER — MECLIZINE 25 MG TABLET
25.00 mg | ORAL_TABLET | Freq: Two times a day (BID) | ORAL | 0 refills | Status: AC | PRN
Start: 2022-05-06 — End: ?

## 2022-05-06 MED ORDER — MECLIZINE 25 MG TABLET
ORAL_TABLET | ORAL | Status: AC
Start: 2022-05-06 — End: 2022-05-06
  Filled 2022-05-06: qty 1

## 2022-05-06 NOTE — ED Provider Notes (Signed)
[facility], [facility] Emergency Department  ED Primary Provider Note  History of Present Illness   Chief Complaint   Patient presents with    Flu Like Symptoms     Arrival: The patient arrived by Car  Judy Hicks is a 72 y.o. female who had concerns including Flu Like Symptoms. Pt states she has been dizzy for years seen ENT /hb clinic told neuropathy. States cough prod for a month cold x. Denies sob    Review of Systems   Constitutional: No fever, chills or weakness   Skin: No rash or diaphoresis  HENT: No headaches, + congestion  Eyes: No vision changes or photophobia   Cardio: No chest pain, palpitations or leg swelling   Respiratory: + cough, wheezing no  SOB  GI:  No nausea, vomiting or stool changes  GU:  No dysuria, hematuria, or increased frequency  MSK: No muscle aches, joint or back pain  Neuro: No seizures, LOC, numbness, tingling, or focal weakness + dizzy   Psychiatric: No depression, SI or substance abuse  All other systems reviewed and are negative.    Historical Data   History Reviewed This Encounter:all noted and reviewed    Physical Exam   ED Triage Vitals [05/06/22 1405]   BP (Non-Invasive) (!) 146/84   Heart Rate 81   Respiratory Rate 19   Temperature 36.6 C (97.8 F)   SpO2 98 %   Weight 95.3 kg (210 lb)   Height 1.651 m (_0 )       Constitutional:  72 y.o. female who appears in no distress. Normal color, no cyanosis.   HENT:   Head: Normocephalic and atraumatic.   Mouth/Throat: Oropharynx is red and moist.   Eyes: EOMI, PERRL   Neck: Trachea midline. Neck supple.  Cardiovascular: RRR, No murmurs, rubs or gallops. Intact distal pulses.  Pulmonary/Chest: BS equal bilaterally. No respiratory distress. No wheezes, rales or chest tenderness.   Abdominal: Bowel sounds present and normal. Abdomen soft, no tenderness, no rebound and no guarding.  Back: No midline spinal tenderness, no paraspinal tenderness, no CVA tenderness.           Musculoskeletal: No edema,  tenderness or deformity.  Skin: warm and dry. No rash, erythema, pallor or cyanosis  Psychiatric: normal mood and affect. Behavior is normal.   Neurological: Patient keenly alert and responsive, easily able to raise eyebrows, facial muscles/expressions symmetric, speaking in fluent sentences, moving all extremities equally and fully, normal gait  Patient Data     Labs Ordered/Reviewed   COMPREHENSIVE METABOLIC PANEL, NON-FASTING - Abnormal; Notable for the following components:       Result Value    BUN 19 (*)     CREATININE 1.04 (*)     ESTIMATED GFR 57 (*)     GLUCOSE 118 (*)     All other components within normal limits    Narrative:     Estimated Glomerular Filtration Rate (eGFR) is calculated using the CKD-EPI (2021) equation, intended for patients 45 years of age and older. If gender is not documented or "unknown", there will be no eGFR calculation.   CBC WITH DIFF - Abnormal; Notable for the following components:    MCH 33.7 (*)     MPV 7.3 (*)     All other components within normal limits   RAPID THROAT SCREEN, STREPTOCOCCUS, WITH REFLEX - Normal   COVID-19, FLU A/B, RSV RAPID BY PCR - Normal    Narrative:  Results are for the simultaneous qualitative identification of SARS-CoV-2 (formerly 2019-nCoV), Influenza A, Influenza B, and RSV RNA. These etiologic agents are generally detectable in nasopharyngeal and nasal swabs during the ACUTE PHASE of infection. Hence, this test is intended to be performed on respiratory specimens collected from individuals with signs and symptoms of upper respiratory tract infection who meet Centers for Disease Control and Prevention (CDC) clinical and/or epidemiological criteria for Coronavirus Disease 2019 (COVID-19) testing. CDC COVID-19 criteria for testing on human specimens is available at [facility] webpage information for Healthcare Professionals: Coronavirus Disease 2019 (COVID-19) ([email or web address]).     False-negative results may  occur if the virus has genomic mutations, insertions, deletions, or rearrangements or if performed very early in the course of illness. Otherwise, negative results indicate virus specific RNA targets are not detected, however negative results do not preclude SARS-CoV-2 infection/COVID-19, Influenza, or Respiratory syncytial virus infection. Results should not be used as the sole basis for patient management decisions. Negative results must be combined with clinical observations, patient history, and epidemiological information. If upper respiratory tract infection is still suspected based on exposure history together with other clinical findings, re-testing should be considered.    Disclaimer:   This assay has been authorized by FDA under an Emergency Use Authorization for use in laboratories certified under the Clinical Laboratory Improvement Amendments of 1988 (CLIA), 42 U.S.C. [phone], to perform high complexity tests. The impacts of vaccines, antiviral therapeutics, antibiotics, chemotherapeutic or immunosuppressant drugs have not been evaluated.     Test methodology:   Cepheid Xpert Xpress SARS-CoV-2/Flu/RSV Assay real-time polymerase chain reaction (RT-PCR) test on the GeneXpert Dx and Xpert Xpress systems.   MAGNESIUM - Normal   THROAT CULTURE, BETA HEMOLYTIC STREPTOCOCCUS   CBC/DIFF    Narrative:     The following orders were created for panel order CBC/DIFF.  Procedure                               Abnormality         Status                     ---------                               -----------         ------                     CBC WITH DIFF[[id number]]                Abnormal            Final result                 Please view results for these tests on the individual orders.     XR CHEST AP   Final Result by Edi, Radresults In (01/01 1436)   NEGATIVE CHEST         Radiologist location ID: [place] Decision Making   Diff dx of uri dehydration. Meniere syndrome, covid uri flu pneumonia.           Medications Administered in the ED   meclizine (ANTIVERT) tablet (25 mg Oral Given 05/06/22 1421)     Clinical Impression   Acute URI (Primary)   Dizziness, nonspecific - history of chronic  Disposition: Discharged

## 2022-05-06 NOTE — ED Triage Notes (Signed)
Patient states it has been like this off and on for a week or so.  Cough, HA, sore throat, chills, body aches

## 2022-05-06 NOTE — ED Nurses Note (Signed)
Patient given discharge instructions. Prescriptions escribed to preferred pharmacy. Encouraged follow up with PCP if symptoms persist or get worse. All questions answered. Verbalized understanding and denied further needs.

## 2022-05-08 LAB — THROAT CULTURE, BETA HEMOLYTIC STREPTOCOCCUS: THROAT CULTURE: NORMAL

## 2022-06-11 ENCOUNTER — Encounter (HOSPITAL_COMMUNITY): Payer: Self-pay

## 2022-06-11 ENCOUNTER — Other Ambulatory Visit: Payer: Self-pay

## 2022-06-11 ENCOUNTER — Emergency Department
Admission: EM | Admit: 2022-06-11 | Discharge: 2022-06-12 | Disposition: A | Discharge status: Home / Self Care | Payer: Medicare HMO | Attending: Emergency Medicine | Admitting: Emergency Medicine

## 2022-06-11 DIAGNOSIS — M25551 Pain in right hip: Secondary | ICD-10-CM

## 2022-06-11 DIAGNOSIS — Z96643 Presence of artificial hip joint, bilateral: Secondary | ICD-10-CM | POA: Insufficient documentation

## 2022-06-11 NOTE — ED Triage Notes (Signed)
Bilateral leg swelling for 2 days, pain from hip to feet, worsening.

## 2022-06-12 ENCOUNTER — Emergency Department (HOSPITAL_COMMUNITY): Payer: Medicare HMO

## 2022-06-12 LAB — BASIC METABOLIC PANEL
ANION GAP: 8 mmol/L (ref 4–13)
BUN/CREA RATIO: 18 (ref 6–22)
BUN: 16 mg/dL (ref 7–25)
CALCIUM: 9.3 mg/dL (ref 8.6–10.3)
CHLORIDE: 108 mmol/L — ABNORMAL HIGH (ref 98–107)
CO2 TOTAL: 22 mmol/L (ref 21–31)
CREATININE: 0.9 mg/dL (ref 0.60–1.30)
ESTIMATED GFR: 68 mL/min/{1.73_m2} (ref >59)
GLUCOSE: 120 mg/dL — ABNORMAL HIGH (ref 74–109)
OSMOLALITY, CALCULATED: 278 mOsm/kg (ref 270–290)
POTASSIUM: 3.8 mmol/L (ref 3.5–5.1)
SODIUM: 138 mmol/L (ref 136–145)

## 2022-06-12 LAB — CBC WITH DIFF
BASOPHIL #: 0.1 10*3/uL (ref 0.00–0.10)
BASOPHIL %: 1 % (ref 0–1)
EOSINOPHIL #: 0.2 10*3/uL (ref 0.00–0.50)
EOSINOPHIL %: 3 %
HCT: 38.5 % (ref 31.2–41.9)
HGB: 13.1 g/dL (ref 10.9–14.3)
LYMPHOCYTE #: 2.3 10*3/uL (ref 1.00–3.00)
LYMPHOCYTE %: 38 % (ref 16–44)
MCH: 33.5 pg — ABNORMAL HIGH (ref 24.7–32.8)
MCHC: 34 g/dL (ref 32.3–35.6)
MCV: 98.5 fL — ABNORMAL HIGH (ref 75.5–95.3)
MONOCYTE #: 0.5 10*3/uL (ref 0.30–1.00)
MONOCYTE %: 8 % (ref 5–13)
MPV: 8 fL (ref 7.9–10.8)
NEUTROPHIL #: 2.9 10*3/uL (ref 1.85–7.80)
NEUTROPHIL %: 50 % (ref 43–77)
PLATELETS: 216 10*3/uL (ref 140–440)
RBC: 3.91 10*6/uL (ref 3.63–4.92)
RDW: 14.8 % (ref 12.3–17.7)
WBC: 5.9 10*3/uL (ref 3.8–11.8)

## 2022-06-12 LAB — C-REACTIVE PROTEIN(CRP),INFLAMMATION: C-REACTIVE PROTEIN (CRP): 0.9 mg/dL — ABNORMAL HIGH (ref 0.1–0.5)

## 2022-06-12 LAB — B-TYPE NATRIURETIC PEPTIDE (BNP),PLASMA: BNP: 64 pg/mL (ref 5–100)

## 2022-06-12 MED ORDER — ONDANSETRON 4 MG DISINTEGRATING TABLET
4.0000 mg | ORAL_TABLET | ORAL | Status: DC
Start: 2022-06-12 — End: 2022-06-12
  Administered 2022-06-12: 0 mg via ORAL

## 2022-06-12 MED ORDER — HYDROCODONE 5 MG-ACETAMINOPHEN 325 MG TABLET
1.0000 | ORAL_TABLET | Freq: Four times a day (QID) | ORAL | 0 refills | Status: DC | PRN
Start: 2022-06-12 — End: 2022-12-15

## 2022-06-12 MED ORDER — HYDROCODONE 5 MG-ACETAMINOPHEN 325 MG TABLET
ORAL_TABLET | ORAL | Status: AC
Start: 2022-06-12 — End: 2022-06-12
  Filled 2022-06-12: qty 1

## 2022-06-12 MED ORDER — ONDANSETRON 4 MG DISINTEGRATING TABLET
ORAL_TABLET | ORAL | Status: AC
Start: 2022-06-12 — End: 2022-06-12
  Filled 2022-06-12: qty 1

## 2022-06-12 MED ORDER — HYDROCODONE 5 MG-ACETAMINOPHEN 325 MG TABLET
1.0000 | ORAL_TABLET | ORAL | Status: DC
Start: 2022-06-12 — End: 2022-06-12
  Administered 2022-06-12: 0 via ORAL

## 2022-06-12 NOTE — Discharge Instructions (Addendum)
Hydrocodone 5 mg 1 every 6 hours as needed for severe pain.      Follow up with the primary care provider soon as possible.      You also should follow up with Orthopedics as soon as possible.  You may need revision of the hip.      Return to emergency department if worse and as needed.

## 2022-06-12 NOTE — ED Provider Notes (Signed)
[facility]  Emergency Department  Attending Provider Note      CHIEF COMPLAINT  Chief Complaint   Patient presents with    Leg Pain     HISTORY OF PRESENT ILLNESS  Judy Hicks, date of birth 10-09-1950, is a 72 y.o. female who presented to the Emergency Department with pain in her right hip.  She states the pain has increased over the past few months.  She states she now has difficulty walking due to the pain.    PAST MEDICAL/SURGICAL/FAMILY/SOCIAL HISTORY  Past Medical History:   Diagnosis Date    CAD (coronary artery disease) 09/24/2021    Depression 09/24/2021    Essential hypertension 09/24/2021    GERD (gastroesophageal reflux disease)     History of MI (myocardial infarction) 09/24/2021    HTN (hypertension)     Hyperlipidemia 09/24/2021    Lupus (CMS [place]) 09/24/2021    Lyme disease     Neuropathy (CMS HCC) 09/24/2021    Restless legs syndrome     Sleep apnea 09/24/2021    Thyroid disease 09/24/2021    Urinary incontinence 09/24/2021       Past Surgical History:   Procedure Laterality Date    CARDIAC CATHETERIZATION      CATARACT EXTRACTION, BILATERAL      HX APPENDECTOMY      HX BUNIONECTOMY Left     HX HIP REPLACEMENT Bilateral     HX HYSTERECTOMY         Family Medical History:       Problem Relation (Age of Onset)    Colon Cancer Mother    Diabetes Other    Esophageal cancer Father    Hypertension (High Blood Pressure) Other    Liver Cancer Mother          Social History     Socioeconomic History    Marital status: Married   Tobacco Use    Smoking status: Never    Smokeless tobacco: Never   Vaping Use    Vaping Use: Never used   Substance and Sexual Activity    Alcohol use: Never    Drug use: Never      ALLERGIES  Allergies   Allergen Reactions    Azithromycin Itching, Rash, Shortness of Breath and Swelling     Other reaction(s): Other (see comments), Sweating (intolerance)    Gemfibrozil Itching, Rash, Shortness of Breath and Swelling     Other reaction(s): Other (see comments), Other (See  Comments), Other - See Comments  Joint pain and fatigue   Joint pain and fatigue   Joint pain and fatigue   Joint pain and fatigue       Lisinopril Itching, Rash, Shortness of Breath and Swelling     Other reaction(s): Other (see comments)    Penicillins Shortness of Breath    Statins-Hmg-Coa Reductase Inhibitors Shortness of Breath    Levofloxacin Rash     Other reaction(s): Other (see comments)    Niacin Rash    Strawberry Itching and Rash    Ezetimibe Hives/ Urticaria     Other reaction(s): Other (see comments)       PHYSICAL EXAM  VITAL SIGNS:  Filed Vitals:    06/12/22 0000 06/12/22 0100 06/12/22 0130 06/12/22 0200   BP: 129/86 (!) 153/92 (!) 111/95 (!) 125/92   Pulse:       Resp:       Temp:       SpO2: 97% 99% 97% 98%  GENERAL: PATIENT IS ALERT AND ORIENTED TO PERSON, PLACE, AND TIME.  CARDIOVASCULAR: REGULAR RATE, AND RHYTHM. NO MURMUR.  LUNGS: CLEAR TO AUSCULTATION BILATERAL.  ABDOMEN: SOFT, NON-TENDER, NON-DISTENDED, AND BOWEL SOUNDS ARE PRESENT.  GENITOURINARY: DEFERRED.  RECTAL: DEFERRED.  EXTREMITIES:  Pain with movement of the right hip  SKIN: WARM AND DRY.  NEUROLOGIC: CRANIAL NERVES II THROUGH XII ARE GROSSLY INTACT. MOVES ALL 4 EXTREMITIES.  PSYCHIATRIC: JUDGMENT AND INSIGHT ARE SEEMINGLY INTACT. MOOD AND AFFECT ARE APPROPRIATE FOR THE SITUATION.    DIAGNOSTICS  Labs:  Labs listed below were reviewed and interpreted by me.  Results for orders placed or performed during the hospital encounter of 06/11/22   C-REACTIVE PROTEIN(CRP),INFLAMMATION   Result Value Ref Range    C-REACTIVE PROTEIN (CRP) 0.9 (H) 0.1 - 0.5 mg/dL   BASIC METABOLIC PANEL   Result Value Ref Range    SODIUM 138 136 - 145 mmol/L    POTASSIUM 3.8 3.5 - 5.1 mmol/L    CHLORIDE 108 (H) 98 - 107 mmol/L    CO2 TOTAL 22 21 - 31 mmol/L    ANION GAP 8 4 - 13 mmol/L    CALCIUM 9.3 8.6 - 10.3 mg/dL    GLUCOSE 120 (H) 74 - 109 mg/dL    BUN 16 7 - 25 mg/dL    CREATININE 0.90 0.60 - 1.30 mg/dL    BUN/CREA RATIO 18 6 - 22    ESTIMATED GFR 68  >59 mL/min/1.59m^2    OSMOLALITY, CALCULATED 278 270 - 290 mOsm/kg   CBC WITH DIFF   Result Value Ref Range    WBC 5.9 3.8 - 11.8 x10^3/uL    RBC 3.91 3.63 - 4.92 x10^6/uL    HGB 13.1 10.9 - 14.3 g/dL    HCT 38.5 31.2 - 41.9 %    MCV 98.5 (H) 75.5 - 95.3 fL    MCH 33.5 (H) 24.7 - 32.8 pg    MCHC 34.0 32.3 - 35.6 g/dL    RDW 14.8 12.3 - 17.7 %    PLATELETS 216 140 - 440 x10^3/uL    MPV 8.0 7.9 - 10.8 fL    NEUTROPHIL % 50 43 - 77 %    LYMPHOCYTE % 38 16 - 44 %    MONOCYTE % 8 5 - 13 %    EOSINOPHIL % 3 %    BASOPHIL % 1 0 - 1 %    NEUTROPHIL # 2.90 1.85 - 7.80 x10^3/uL    LYMPHOCYTE # 2.30 1.00 - 3.00 x10^3/uL    MONOCYTE # 0.50 0.30 - 1.00 x10^3/uL    EOSINOPHIL # 0.20 0.00 - 0.50 x10^3/uL    BASOPHIL # 0.10 0.00 - 0.10 x10^3/uL     Radiology:  Results for orders placed or performed during the hospital encounter of 06/11/22   XR HIP RIGHT W PELVIS 2-3 VIEWS     Status: None    Narrative    Judy Hicks    RADIOLOGIST: Betsey Amen, MD    XR HIP RIGHT W PELVIS 2-3 VIEWS performed on 06/12/2022 1:54 AM    CLINICAL HISTORY: right hip pain, total hip prosthesis.  PAIN IN RT HIP; NO RECENT INJURY; PT HAD RT HIP REPLACED IN 1995    TECHNIQUE:  3 views of the right hip including AP pelvis.    COMPARISON:  None.    FINDINGS:   Osteopenia.  Bilateral bipolar total hip arthroplasties. The hardware is intact.  Soft tissues are unremarkable.  Impression    Intact total right hip arthroplasty. No definite cause for the pain. No fracture.           Radiologist location ID: [id number]         ED COURSE/MEDICAL DECISION MAKING  Medications Administered in the ED   HYDROcodone-acetaminophen (NORCO) 5-325 mg per tablet (has no administration in time range)   ondansetron (ZOFRAN ODT) rapid dissolve tablet (has no administration in time range)          Medical Decision Making  This patient presents to the ED with severe right hip pain that is limiting her mobility.  The hip is prosthetic and was done in 1995.  She states  the pain has increased over the past 2-3 months.  She states she denies pain with any movement of the hip.  Labs were obtained to rule out any type of infection or inflammatory process.  These are all unremarkable.  X-ray was obtained that was read as normal by Radiology.  The patient will be discharged home with prescription for hydrocodone.  She is encouraged to follow up with Orthopedics as soon as possible.        CLINICAL IMPRESSION  Clinical Impression   Right hip pain (Primary)     DISPOSITION  Discharged       DISCHARGE MEDICATIONS  Current Discharge Medication List        START taking these medications    Details   HYDROcodone-acetaminophen (NORCO) 5-325 mg Oral Tablet Take 1 Tablet by mouth Every 6 hours as needed for Pain  Qty: 12 Tablet, Refills: 0             Quita Skye Sabra Heck D.O.   06/12/2022, 02:56   [facility]  Department of Emergency Medicine  [facility]    Contents of the document, in whole or in part, are completed utilizing M*Modal dictation technology, please forgive any typographical errors that may exist.   -----

## 2022-07-01 ENCOUNTER — Ambulatory Visit (HOSPITAL_COMMUNITY)
Admission: RE | Admit: 2022-07-01 | Discharge: 2022-07-01 | Disposition: A | Discharge status: Home / Self Care | Payer: Self-pay | Source: Ambulatory Visit

## 2022-07-11 ENCOUNTER — Other Ambulatory Visit (HOSPITAL_COMMUNITY): Payer: Self-pay | Admitting: PSYCHIATRY AND NEUROLOGY-NEUROLOGY

## 2022-07-11 DIAGNOSIS — R42 Dizziness and giddiness: Secondary | ICD-10-CM

## 2022-07-23 ENCOUNTER — Ambulatory Visit (HOSPITAL_COMMUNITY): Payer: Medicare HMO

## 2022-08-03 ENCOUNTER — Ambulatory Visit
Admission: RE | Admit: 2022-08-03 | Discharge: 2022-08-03 | Disposition: A | Discharge status: Home / Self Care | Payer: Medicare HMO | Source: Ambulatory Visit | Attending: PSYCHIATRY AND NEUROLOGY-NEUROLOGY | Admitting: PSYCHIATRY AND NEUROLOGY-NEUROLOGY

## 2022-08-03 ENCOUNTER — Ambulatory Visit (HOSPITAL_COMMUNITY): Payer: Medicare HMO

## 2022-08-03 ENCOUNTER — Other Ambulatory Visit: Payer: Self-pay

## 2022-08-03 DIAGNOSIS — R202 Paresthesia of skin: Secondary | ICD-10-CM | POA: Insufficient documentation

## 2022-08-03 DIAGNOSIS — R42 Dizziness and giddiness: Secondary | ICD-10-CM | POA: Insufficient documentation

## 2022-08-03 DIAGNOSIS — G629 Polyneuropathy, unspecified: Secondary | ICD-10-CM | POA: Insufficient documentation

## 2022-08-03 LAB — VITAMIN B12: VITAMIN B 12: 400 pg/mL (ref 180–914)

## 2022-08-06 LAB — COPPER, SERUM: COPPER: 115 ug/dL (ref 70–175)

## 2022-08-07 LAB — VITAMIN E, SERUM
ALPHA-TOCOPHEROL: 8.6 mg/L (ref 5.7–19.9)
BETA-GAMMA TOCOPHEROL: 1.4 mg/L (ref <=4.3)

## 2022-12-15 ENCOUNTER — Other Ambulatory Visit: Payer: Self-pay

## 2022-12-15 ENCOUNTER — Emergency Department
Admission: EM | Admit: 2022-12-15 | Discharge: 2022-12-18 | Disposition: A | Discharge status: Home / Self Care | Payer: Medicare HMO | Attending: Emergency Medicine | Admitting: Emergency Medicine

## 2022-12-15 ENCOUNTER — Emergency Department (HOSPITAL_COMMUNITY): Payer: Medicare HMO

## 2022-12-15 DIAGNOSIS — E860 Dehydration: Secondary | ICD-10-CM | POA: Insufficient documentation

## 2022-12-15 DIAGNOSIS — R0789 Other chest pain: Secondary | ICD-10-CM | POA: Insufficient documentation

## 2022-12-15 LAB — CBC WITH DIFF
BASOPHIL #: 0.1 10*3/uL (ref 0.00–0.10)
BASOPHIL %: 1 % (ref 0–1)
EOSINOPHIL #: 0.2 10*3/uL (ref 0.00–0.50)
EOSINOPHIL %: 3 % (ref 1–7)
HCT: 40.6 % (ref 31.2–41.9)
HGB: 13.6 g/dL (ref 10.9–14.3)
LYMPHOCYTE #: 2.9 10*3/uL (ref 1.00–3.00)
LYMPHOCYTE %: 43 % (ref 16–44)
MCH: 32.5 pg (ref 24.7–32.8)
MCHC: 33.5 g/dL (ref 32.3–35.6)
MCV: 96.9 fL — ABNORMAL HIGH (ref 75.5–95.3)
MONOCYTE #: 0.5 10*3/uL (ref 0.30–1.00)
MONOCYTE %: 7 % (ref 5–13)
MPV: 7.9 fL (ref 7.9–10.8)
NEUTROPHIL #: 3.1 10*3/uL (ref 1.85–7.80)
NEUTROPHIL %: 46 % (ref 43–77)
PLATELETS: 199 10*3/uL (ref 140–440)
RBC: 4.19 10*6/uL (ref 3.63–4.92)
RDW: 13.4 % (ref 12.3–17.7)
WBC: 6.7 10*3/uL (ref 3.8–11.8)

## 2022-12-15 LAB — COMPREHENSIVE METABOLIC PANEL, NON-FASTING
ALBUMIN/GLOBULIN RATIO: 1.5 — ABNORMAL HIGH (ref 0.8–1.4)
ALBUMIN: 3.9 g/dL (ref 3.5–5.7)
ALKALINE PHOSPHATASE: 61 U/L (ref 34–104)
ALT (SGPT): 20 U/L (ref 7–52)
ANION GAP: 17 mmol/L — ABNORMAL HIGH (ref 4–13)
AST (SGOT): 22 U/L (ref 13–39)
BILIRUBIN TOTAL: 0.5 mg/dL (ref 0.3–1.0)
BUN/CREA RATIO: 14 (ref 6–22)
BUN: 14 mg/dL (ref 7–25)
CALCIUM, CORRECTED: 9.1 mg/dL (ref 8.9–10.8)
CALCIUM: 9 mg/dL (ref 8.6–10.3)
CHLORIDE: 110 mmol/L — ABNORMAL HIGH (ref 98–107)
CO2 TOTAL: 16 mmol/L — ABNORMAL LOW (ref 21–31)
CREATININE: 1.03 mg/dL (ref 0.60–1.30)
ESTIMATED GFR: 58 mL/min/{1.73_m2} — ABNORMAL LOW (ref >59)
GLOBULIN: 2.6 — ABNORMAL LOW (ref 2.9–5.4)
GLUCOSE: 136 mg/dL — ABNORMAL HIGH (ref 74–109)
OSMOLALITY, CALCULATED: 288 mOsm/kg (ref 270–290)
POTASSIUM: 4 mmol/L (ref 3.5–5.1)
PROTEIN TOTAL: 6.5 g/dL (ref 6.4–8.9)
SODIUM: 143 mmol/L (ref 136–145)

## 2022-12-15 LAB — COVID-19, FLU A/B, RSV RAPID BY PCR
INFLUENZA VIRUS TYPE A: NOT DETECTED
INFLUENZA VIRUS TYPE B: NOT DETECTED
RESPIRATORY SYNCTIAL VIRUS (RSV): NOT DETECTED
SARS-CoV-2: NOT DETECTED

## 2022-12-15 LAB — TROPONIN-I
TROPONIN I: 5 ng/L (ref <15)
TROPONIN I: 3 ng/L (ref <15)
TROPONIN I: 4 ng/L (ref <15)

## 2022-12-15 LAB — URINALYSIS, MICROSCOPIC
HYALINE CASTS: 24 /lpf — ABNORMAL HIGH (ref <0)
RBCS: 1 /hpf (ref <4)
RENAL EPITHELIAL CELLS URINE: <1 /hpf (ref <6)
SQUAMOUS EPITHELIAL: 7 /hpf (ref <28)
TRANSITIONAL EPITHELIAL CELLS URINE: <1 /hpf (ref <6)
WBC CASTS: 12 /lpf — ABNORMAL HIGH (ref <1)
WBCS: 6 /hpf — ABNORMAL HIGH (ref <6)

## 2022-12-15 LAB — URINALYSIS, MACROSCOPIC
BILIRUBIN: NEGATIVE mg/dL
BLOOD: NEGATIVE mg/dL
GLUCOSE: NEGATIVE mg/dL
KETONES: NEGATIVE mg/dL
LEUKOCYTES: NEGATIVE WBCs/uL
NITRITE: NEGATIVE
PH: 5 (ref 5.0–9.0)
PROTEIN: 10 mg/dL
SPECIFIC GRAVITY: 1.019 (ref 1.002–1.030)
UROBILINOGEN: NORMAL mg/dL

## 2022-12-15 LAB — GRAY TOP TUBE

## 2022-12-15 LAB — PTT (PARTIAL THROMBOPLASTIN TIME): APTT: 29.2 seconds (ref 25.0–38.0)

## 2022-12-15 LAB — MAGNESIUM: MAGNESIUM: 2 mg/dL (ref 1.9–2.7)

## 2022-12-15 LAB — B-TYPE NATRIURETIC PEPTIDE (BNP),PLASMA: BNP: 73 pg/mL (ref 1–100)

## 2022-12-15 LAB — C-REACTIVE PROTEIN (CRP): C-REACTIVE PROTEIN (CRP): 0.9 mg/dL — ABNORMAL HIGH (ref 0.1–0.5)

## 2022-12-15 LAB — LAVENDER TOP TUBE

## 2022-12-15 LAB — PT/INR
INR: 0.99 (ref 0.84–1.10)
PROTHROMBIN TIME: 11.6 seconds (ref 9.8–12.7)

## 2022-12-15 LAB — THYROID STIMULATING HORMONE WITH FREE T4 REFLEX: TSH: 3.459 u[IU]/mL (ref 0.450–5.330)

## 2022-12-15 MED ORDER — SODIUM CHLORIDE 0.9 % IV BOLUS
1000.0000 mL | INJECTION | Status: AC
Start: 2022-12-15 — End: 2022-12-15
  Administered 2022-12-15: 1000 mL via INTRAVENOUS

## 2022-12-15 NOTE — ED Provider Notes (Signed)
[facility]  Emergency Department  Attending Provider Note      CHIEF COMPLAINT  Chief Complaint   Patient presents with    Chest Pain     Shortness of Breath     HISTORY OF PRESENT ILLNESS  Judy Hicks, date of birth 06-06-1950, is a 72 y.o. female who presented to the Emergency Department.    THE PATIENT HAD SOME CHEST TIGHTNESS AND FELT PRESYNCOPAL.  REPORTS SOME NAUSEA AND SOME SWEATS.  BY THE TIME SHE GOT HERE SHE HAD FELT SOME BETTER.  BLOOD PRESSURE WAS SLIGHTLY LOW.  PATIENT STATES SHE DOES THIS PERIODICALLY.  DENIES ANY FEVERS CHILLS PALPITATIONS DYSURIA HEMATURIA VOMITING.  COMPREHENSIVE 10+ REVIEW OF SYSTEMS OTHERWISE NEGATIVE.  NO OTHER ACUTE COMPLAINTS REPORTED BY THE PATIENT.    PAST MEDICAL/SURGICAL/FAMILY/SOCIAL HISTORY  Past Medical History:   Diagnosis Date    CAD (coronary artery disease) 09/24/2021    Depression 09/24/2021    Essential hypertension 09/24/2021    GERD (gastroesophageal reflux disease)     History of MI (myocardial infarction) 09/24/2021    HTN (hypertension)     Hyperlipidemia 09/24/2021    Lupus (CMS HCC) 09/24/2021    Lyme disease     Neuropathy (CMS HCC) 09/24/2021    Restless legs syndrome     Sleep apnea 09/24/2021    Thyroid disease 09/24/2021    Urinary incontinence 09/24/2021       Past Surgical History:   Procedure Laterality Date    CARDIAC CATHETERIZATION      CATARACT EXTRACTION, BILATERAL      HX APPENDECTOMY      HX BUNIONECTOMY Left     HX HIP REPLACEMENT Bilateral     HX HYSTERECTOMY         Family Medical History:       Problem Relation (Age of Onset)    Colon Cancer Mother    Diabetes Other    Esophageal cancer Father    Hypertension (High Blood Pressure) Other    Liver Cancer Mother          Social History     Socioeconomic History    Marital status: Married   Tobacco Use    Smoking status: Never    Smokeless tobacco: Never   Vaping Use    Vaping status: Never Used   Substance and Sexual Activity    Alcohol use: Never    Drug use: Never       ALLERGIES  Allergies   Allergen Reactions    Azithromycin Itching, Rash, Shortness of Breath and Swelling     Other reaction(s): Other (see comments), Sweating (intolerance)    Gemfibrozil Itching, Rash, Shortness of Breath and Swelling     Other reaction(s): Other (see comments), Other (See Comments), Other - See Comments  Joint pain and fatigue   Joint pain and fatigue   Joint pain and fatigue   Joint pain and fatigue       Lisinopril Itching, Rash, Shortness of Breath and Swelling     Other reaction(s): Other (see comments)    Penicillins Shortness of Breath    Statins-Hmg-Coa Reductase Inhibitors Shortness of Breath    Levofloxacin Rash     Other reaction(s): Other (see comments)    Niacin Rash    Strawberry Itching and Rash    Ezetimibe Hives/ Urticaria     Other reaction(s): Other (see comments)       PHYSICAL EXAM  VITAL SIGNS:  Filed Vitals:    12/15/22  1515 12/15/22 1530 12/15/22 1545 12/15/22 1600   BP: 119/81 120/82 116/70 118/76   Pulse: 82 80 78 82   Resp: (!) 21 19 19 15    Temp:       SpO2: 96% 96% 95% 93%     GENERAL: PATIENT IS ALERT AND ORIENTED TO PERSON, PLACE, AND TIME.  HEAD: NORMOCEPHALIC AND ATRAUMATIC.  EYES: PUPILS EQUALLY ROUND AND REACT TO LIGHT. EXTRAOCULAR MOVEMENTS INTACT.  EARS: GROSS HEARING INTACT. EXTERNAL EARS WITHIN NORMAL LIMITS.  NOSE: NO SEPTAL DEVIATION. NASAL PASSAGES CLEAR.  THROAT: MOIST ORAL MUCOSA.   NECK: SUPPLE. TRACHEA MIDLINE.  HEART: REGULAR, RATE, AND RHYTHM.  LUNGS: CLEAR TO AUSCULTATION BILATERAL BUT DECREASED.  ABDOMEN: SOFT, NON-TENDER, NON-DISTENDED, AND BOWEL SOUNDS ARE PRESENT.  OVERWEIGHT.  GENITOURINARY: DEFERRED.  RECTAL: DEFERRED.  EXTREMITIES: NO CYANOSIS, CLUBBING, OR EDEMA.  SKIN: WARM AND DRY.  MUSCULOSKELETAL: DEFERRED.  NEUROLOGIC: CRANIAL NERVES II THROUGH XII ARE GROSSLY INTACT. SENSATION TO LIGHT TOUCH IS INTACT.  PSYCHIATRIC: JUDGMENT AND INSIGHT ARE SEEMINGLY INTACT. MOOD AND AFFECT ARE APPROPRIATE FOR THE SITUATION.    DIAGNOSTICS  Labs:   Labs listed below were reviewed and interpreted by me.  Results for orders placed or performed during the hospital encounter of 12/15/22   COMPREHENSIVE METABOLIC PANEL, NON-FASTING   Result Value Ref Range    SODIUM 143 136 - 145 mmol/L    POTASSIUM 4.0 3.5 - 5.1 mmol/L    CHLORIDE 110 (H) 98 - 107 mmol/L    CO2 TOTAL 16 (L) 21 - 31 mmol/L    ANION GAP 17 (H) 4 - 13 mmol/L    BUN 14 7 - 25 mg/dL    CREATININE 9.14 7.82 - 1.30 mg/dL    BUN/CREA RATIO 14 6 - 22    ESTIMATED GFR 58 (L) >59 mL/min/1.27m^2    ALBUMIN 3.9 3.5 - 5.7 g/dL    CALCIUM 9.0 8.6 - 95.6 mg/dL    GLUCOSE 213 (H) 74 - 109 mg/dL    ALKALINE PHOSPHATASE 61 34 - 104 U/L    ALT (SGPT) 20 7 - 52 U/L    AST (SGOT) 22 13 - 39 U/L    BILIRUBIN TOTAL 0.5 0.3 - 1.0 mg/dL    PROTEIN TOTAL 6.5 6.4 - 8.9 g/dL    ALBUMIN/GLOBULIN RATIO 1.5 (H) 0.8 - 1.4    OSMOLALITY, CALCULATED 288 270 - 290 mOsm/kg    CALCIUM, CORRECTED 9.1 8.9 - 10.8 mg/dL    GLOBULIN 2.6 (L) 2.9 - 5.4   TROPONIN-I NOW   Result Value Ref Range    TROPONIN I 5 <15 ng/L   CBC WITH DIFF   Result Value Ref Range    WBC 6.7 3.8 - 11.8 x10^3/uL    RBC 4.19 3.63 - 4.92 x10^6/uL    HGB 13.6 10.9 - 14.3 g/dL    HCT 08.6 57.8 - 46.9 %    MCV 96.9 (H) 75.5 - 95.3 fL    MCH 32.5 24.7 - 32.8 pg    MCHC 33.5 32.3 - 35.6 g/dL    RDW 62.9 52.8 - 41.3 %    PLATELETS 199 140 - 440 x10^3/uL    MPV 7.9 7.9 - 10.8 fL    NEUTROPHIL % 46 43 - 77 %    LYMPHOCYTE % 43 16 - 44 %    MONOCYTE % 7 5 - 13 %    EOSINOPHIL % 3 1 - 7 %    BASOPHIL % 1 0 - 1 %  NEUTROPHIL # 3.10 1.85 - 7.80 x10^3/uL    LYMPHOCYTE # 2.90 1.00 - 3.00 x10^3/uL    MONOCYTE # 0.50 0.30 - 1.00 x10^3/uL    EOSINOPHIL # 0.20 0.00 - 0.50 x10^3/uL    BASOPHIL # 0.10 0.00 - 0.10 x10^3/uL   THYROID STIMULATING HORMONE WITH FREE T4 REFLEX   Result Value Ref Range    TSH 3.459 0.450 - 5.330 uIU/mL   PT/INR   Result Value Ref Range    PROTHROMBIN TIME 11.6 9.8 - 12.7 seconds    INR 0.99 0.84 - 1.10   PTT (PARTIAL THROMBOPLASTIN TIME)   Result Value Ref  Range    APTT 29.2 25.0 - 38.0 seconds   MAGNESIUM   Result Value Ref Range    MAGNESIUM 2.0 1.9 - 2.7 mg/dL   B-TYPE NATRIURETIC PEPTIDE (BNP),PLASMA   Result Value Ref Range    BNP 73 1 - 100 pg/mL   COVID-19, FLU A/B, RSV RAPID BY PCR   Result Value Ref Range    SARS-CoV-2 Not Detected Not Detected    INFLUENZA VIRUS TYPE A Not Detected Not Detected    INFLUENZA VIRUS TYPE B Not Detected Not Detected    RESPIRATORY SYNCTIAL VIRUS (RSV) Not Detected Not Detected   C-REACTIVE PROTEIN (CRP)   Result Value Ref Range    C-REACTIVE PROTEIN (CRP) 0.9 (H) 0.1 - 0.5 mg/dL   URINALYSIS, MACROSCOPIC   Result Value Ref Range    COLOR Yellow Colorless, Light Yellow, Yellow    APPEARANCE Turbid (A) Clear    SPECIFIC GRAVITY 1.019 1.002 - 1.030    PH 5.0 5.0 - 9.0    LEUKOCYTES Negative Negative, 100  WBCs/uL    NITRITE Negative Negative    PROTEIN 10 Negative, 10 , 20  mg/dL    GLUCOSE Negative Negative, 30  mg/dL    KETONES Negative Negative, Trace mg/dL    BILIRUBIN Negative Negative, 0.5 mg/dL    BLOOD Negative Negative, 0.03 mg/dL    UROBILINOGEN Normal Normal mg/dL   URINALYSIS, MICROSCOPIC   Result Value Ref Range    BACTERIA Rare (A) Negative /hpf    MUCOUS Occasional (A) (none) /hpf    RBCS 1 <4 /hpf    WBCS 6 (H) <6 /hpf    HYALINE CASTS 24 (H) <0 /lpf    WBC CASTS 12 (H) <1 /lpf    SQUAMOUS EPITHELIAL 7 <28 /hpf    RENAL EPITHELIAL CELLS URINE <1 <6 /hpf    TRANSITIONAL EPITHELIAL CELLS URINE <1 <6 /hpf   GRAY TOP TUBE   Result Value Ref Range    RAINBOW/EXTRA TUBE AUTO RESULT Yes    TROPONIN-I IN ONE HOUR   Result Value Ref Range    TROPONIN I 3 <15 ng/L   TROPONIN-I IN THREE HOOURS   Result Value Ref Range    TROPONIN I 4 <15 ng/L   ECG 12 LEAD   Result Value Ref Range    Ventricular rate 82 BPM    Atrial Rate 82 BPM    PR Interval 194 ms    QRS Duration 84 ms    QT Interval 400 ms    QTC Calculation 467 ms    Calculated P Axis 46 degrees    Calculated R Axis 37 degrees    Calculated T Axis 36 degrees      Radiology:  Results for orders placed or performed during the hospital encounter of 12/15/22   XR AP MOBILE CHEST  Status: None    Narrative    Judy Hicks    RADIOLOGIST: Lucien Mons    XR AP MOBILE CHEST performed on 12/15/2022 2:09 PM    CLINICAL HISTORY: FEVER.  Ap uprt port, fever    TECHNIQUE: Frontal view of the chest.    COMPARISON:  Chest radiograph dated 04/28/2022    FINDINGS:    The heart size is normal.   The lungs are clear.           Impression    NEGATIVE CHEST        Radiologist location ID: [id number]         ED COURSE/MEDICAL DECISION MAKING  Medications Administered in the ED   NS bolus infusion 1,000 mL (1,000 mL Intravenous New Bag/New Syringe 12/15/22 1330)      ED Course as of 12/15/22 1715   Sun Dec 15, 2022   1243 ECG 12 LEAD  TODAY I, Mellina Benison A. Resa Rinks, D.O., PERSONALLY VISUALIZED THE PATIENT'S EKG TRACING.  I AM THE PRIMARY INTERPRETER.  NORMAL SINUS RHYTHM AT A RATE OF 82.  NO AV BLOCKS.  NORMAL AXIS.  BASELINE WANDERING WITH ARTIFACT.  NONSPECIFIC ST-T CHANGES.  NO LVH.  NO BUNDLE-BRANCH BLOCKS.   1605 XR AP MOBILE CHEST  TODAY I, Gerardine Peltz A. Denzil Bristol, D.O., PERSONALLY VISUALIZED THE PATIENT'S DIAGNOSTIC IMAGING STUDIES.  DEFER TO THE RADIOLOGIST'S REPORT FOR THE FINAL DEFINITIVE RADIOLOGY READING.  MY INITIAL INDEPENDENT INTERPRETATION IS AS FOLLOWS, BUT NOT LIMITED TO:  CHRONIC CHANGES     1712 MAGNESIUM: 2.0  NORMAL   1712 WBC: 6.7  NORMAL   1712 CREATININE: 1.03  NORMAL      Medical Decision Making  Problems Addressed:  Acute dehydration: acute illness or injury  Atypical chest pain: acute illness or injury    Amount and/or Complexity of Data Reviewed  Labs: ordered. Decision-making details documented in ED Course.  Radiology: ordered and independent interpretation performed. Decision-making details documented in ED Course.  ECG/medicine tests: ordered and independent interpretation performed. Decision-making details documented in ED Course.    Risk  Prescription drug  management.  Diagnosis or treatment significantly limited by social determinants of health.      CLINICAL IMPRESSION  Clinical Impression   Atypical chest pain (Primary)   Acute dehydration     DISPOSITION  Discharged       DISCHARGE MEDICATIONS  Current Discharge Medication List          /Rendy Lazard A. Earlene Plater, DO, MBA   12/15/2022, 12:52   [facility]  Department of Emergency Medicine  [facility]    This note was partially generated using MModal Fluency Direct system, and there may be some incorrect words, spellings, and punctuation that were not noted in checking the note before saving.

## 2022-12-15 NOTE — Discharge Instructions (Signed)
THE PATIENT IS TO FOLLOW UP WITH THE PRIMARY CARE PROVIDER AS SOON AS POSSIBLE BUT NO LATER THAN 3 DAYS FROM LEAVING THE EMERGENCY DEPARTMENT.  IF NO PRIMARY CARE PROVIDER EXISTS, THEN THE PATIENT IS INSTRUCTED TO ESTABLISH CARE WITH A PRIMARY CARE PROVIDER AS SOON AS POSSIBLE BUT NO LATER THAN 3 DAYS FROM LEAVING THE EMERGENCY DEPARTMENT.  FOLLOW-UP WITH ANY SPECIALIST PROVIDER AS INDICATED AS SOON AS POSSIBLE BUT NO LATER THAN 3 DAYS, IF APPLICABLE.  NOTIFY THE PRIMARY CARE PROVIDER THAT YOU WERE IN THE EMERGENCY DEPARTMENT WITHIN 24 HOURS OF DISCHARGE TO FOLLOW-UP ON YOUR RESULTS AND/OR TREATMENTS.  RETURN TO THE EMERGENCY DEPARTMENT IMMEDIATELY IF NEEDED, NO BETTER, WORSE, NEW SYMPTOMS ARISE, OR YOU CANNOT FOLLOW-UP WITH YOUR PRIMARY CARE PROVIDER AND/OR APPLICABLE SPECIALIST IN THE PRESCRIBED TIMEFRAME.

## 2022-12-15 NOTE — ED Triage Notes (Addendum)
Sudden onset chest pain/shortness of breath while sitting in [place]. Pt also reported nausea and diaphoresis.     PRS: 12 lead, 324mg  aspirin, NTG x1, 2l nc, duoneb x 1, BS 132

## 2022-12-16 DIAGNOSIS — R079 Chest pain, unspecified: Secondary | ICD-10-CM

## 2022-12-16 LAB — ECG 12 LEAD
Atrial Rate: 82 {beats}/min
Calculated P Axis: 46 degrees
Calculated R Axis: 37 degrees
Calculated T Axis: 36 degrees
PR Interval: 194 ms
QRS Duration: 84 ms
QT Interval: 400 ms
QTC Calculation: 467 ms
Ventricular rate: 82 {beats}/min

## 2022-12-18 LAB — URINE CULTURE,ROUTINE: URINE CULTURE: 10000 — AB

## 2023-01-04 ENCOUNTER — Emergency Department (HOSPITAL_BASED_OUTPATIENT_CLINIC_OR_DEPARTMENT_OTHER): Payer: Medicare HMO

## 2023-01-04 ENCOUNTER — Other Ambulatory Visit: Payer: Self-pay

## 2023-01-04 ENCOUNTER — Encounter (HOSPITAL_BASED_OUTPATIENT_CLINIC_OR_DEPARTMENT_OTHER): Payer: Self-pay

## 2023-01-04 ENCOUNTER — Emergency Department
Admission: EM | Admit: 2023-01-04 | Discharge: 2023-01-04 | Disposition: A | Discharge status: Home / Self Care | Payer: Medicare HMO | Attending: Family | Admitting: Family

## 2023-01-04 DIAGNOSIS — W091XXA Fall from playground swing, initial encounter: Secondary | ICD-10-CM | POA: Insufficient documentation

## 2023-01-04 DIAGNOSIS — W228XXA Striking against or struck by other objects, initial encounter: Secondary | ICD-10-CM

## 2023-01-04 DIAGNOSIS — S8011XA Contusion of right lower leg, initial encounter: Secondary | ICD-10-CM | POA: Insufficient documentation

## 2023-01-04 DIAGNOSIS — S80812A Abrasion, left lower leg, initial encounter: Secondary | ICD-10-CM | POA: Insufficient documentation

## 2023-01-04 DIAGNOSIS — S7001XA Contusion of right hip, initial encounter: Secondary | ICD-10-CM | POA: Insufficient documentation

## 2023-01-04 DIAGNOSIS — Z96641 Presence of right artificial hip joint: Secondary | ICD-10-CM | POA: Insufficient documentation

## 2023-01-04 DIAGNOSIS — Z23 Encounter for immunization: Secondary | ICD-10-CM | POA: Insufficient documentation

## 2023-01-04 MED ORDER — TRAMADOL 37.5 MG-ACETAMINOPHEN 325 MG TABLET
1.0000 | ORAL_TABLET | Freq: Four times a day (QID) | ORAL | 0 refills | Status: AC | PRN
Start: 2023-01-04 — End: ?

## 2023-01-04 MED ORDER — DIPHTH,PERTUSSIS(ACEL),TETANUS 2.5 LF UNIT-8 MCG-5 LF/0.5ML IM SYRINGE
INJECTION | INTRAMUSCULAR | Status: AC
Start: 2023-01-04 — End: 2023-01-04
  Filled 2023-01-04: qty 0.5

## 2023-01-04 MED ORDER — ONDANSETRON 4 MG DISINTEGRATING TABLET
4.0000 mg | ORAL_TABLET | ORAL | Status: AC
Start: 2023-01-04 — End: 2023-01-04
  Administered 2023-01-04: 4 mg via ORAL

## 2023-01-04 MED ORDER — ONDANSETRON 4 MG DISINTEGRATING TABLET
ORAL_TABLET | ORAL | Status: AC
Start: 2023-01-04 — End: 2023-01-04
  Filled 2023-01-04: qty 1

## 2023-01-04 MED ORDER — MORPHINE 4 MG/ML INJECTION WRAPPER
4.0000 mg | INJECTION | INTRAMUSCULAR | Status: AC
Start: 2023-01-04 — End: 2023-01-04
  Administered 2023-01-04: 4 mg via INTRAMUSCULAR

## 2023-01-04 MED ORDER — DIPHTH,PERTUSSIS(ACEL),TETANUS 2.5 LF UNIT-8 MCG-5 LF/0.5ML IM SYRINGE
0.5000 mL | INJECTION | INTRAMUSCULAR | Status: AC
Start: 2023-01-04 — End: 2023-01-04
  Administered 2023-01-04: 0.5 mL via INTRAMUSCULAR

## 2023-01-04 MED ORDER — MORPHINE 4 MG/ML INJECTION WRAPPER
INJECTION | INTRAMUSCULAR | Status: AC
Start: 2023-01-04 — End: 2023-01-04
  Filled 2023-01-04: qty 1

## 2023-01-04 NOTE — ED Nurses Note (Signed)
Sterile dressing applied to abrasion on Rt shin  instructed how to use crutches  demonstrated use

## 2023-01-04 NOTE — Discharge Instructions (Signed)
Rest. Elevate ice. Use crutches or cane for 3 to 4 days or until better. Get rechecked if not better in 7 to 10 days.

## 2023-01-04 NOTE — ED Triage Notes (Signed)
Pt states she was on a swing on  her porch when the swing fell and hit the posterior right lower leg. Bruising is noted.

## 2023-01-04 NOTE — ED Provider Notes (Signed)
[facility], [facility] Emergency Department  ED Primary Provider Note  History of Present Illness   Chief Complaint   Patient presents with    Leg Pain     Arrival: The patient arrived by Ambulance  Judy Hicks is a 72 y.o. female who had concerns including Leg Pain. Pt states was sitting in swing with leg under neath her and swing turned over denies loc nor hitting head denies spine pain co rt hip pain had replacement in 1995 and rt lower leg pain abrasion mid shin. Unsure of last Tdap .  Review of Systems   Constitutional: No fever, chills or weakness   Skin: No rash or diaphoresis+ abrasion swelling   HENT: No headaches, or congestion  Eyes: No vision changes or photophobia   Cardio: No chest pain, palpitations or leg swelling   Respiratory: No cough, wheezing or SOB  GI:  No nausea, vomiting or stool changes  GU:  No dysuria, hematuria, or increased frequency  MSK: No muscle aches,+ joint  pain  Neuro: No seizures, LOC, numbness, tingling, or focal weakness  Psychiatric: No depression, SI or substance abuse  All other systems reviewed and are negative.    History Reviewed This Encounter: all noted and reviewed     Physical Exam   ED Triage Vitals [01/04/23 1447]   BP (Non-Invasive) 117/75   Heart Rate 77   Respiratory Rate 17   Temperature 36.4 C (97.6 F)   SpO2 95 %   Weight 92.1 kg (203 lb)   Height 1.651 m (5\' 5" )       Constitutional:  72 y.o. female who appears in no distress. Normal color, no cyanosis.   HENT:   Head: Normocephalic and atraumatic.   Mouth/Throat: Oropharynx is clear and moist.   Eyes: EOMI, PERRL   Neck: Trachea midline. Neck supple.  Cardiovascular: RRR, No murmurs, rubs or gallops. Intact distal pulses.  Pulmonary/Chest: BS equal bilaterally. No respiratory distress. No wheezes, rales or chest tenderness.   Abdominal: Bowel sounds present and normal. Abdomen soft, no tenderness, no rebound and no guarding.  Back: No midline spinal tenderness, no  paraspinal tenderness, no CVA tenderness.           Musculoskeletal: + mild lower rt leg  edema, tenderness  full sensation cap refill 2 sec no deformity. No shortening no rotation   Skin: warm and dry. No rash, erythema, pallor or cyanosis, + abrasion rt mid shin.   Psychiatric: normal mood and affect. Behavior is normal.   Neurological: Patient keenly alert and responsive, easily able to raise eyebrows, facial muscles/expressions symmetric, speaking in fluent sentences, moving all extremities equally and fully, normal gait  Patient Data   Labs Ordered/Reviewed - No data to display  XR TIBIA-FIBULA RIGHT   Final Result by Edi, Radresults In (08/31 1531)   NO ACUTE FRACTURE OR DISLOCATION.                Radiologist location ID: [id number]         XR HIP RIGHT W PELVIS 2-3 VIEWS   Final Result by Edi, Radresults In (08/31 1531)   Right hip arthroplasty. No acute fracture.                Radiologist location ID: [id number]           Medical Decision Making   Dif dx hip fx contusion. Lower leg fx contusion xrays neg for fxs. Wound cleaned adaptic kerlex, ace applied  rt lower leg , crutches offered.   Pt reports pain relief.          Medications Administered in the ED   diphtheria, pertussis-acell, tetanus (BOOSTRIX) IM injection (0.5 mL IntraMUSCULAR Given 01/04/23 1525)   morphine 4 mg/mL injection (4 mg IntraMUSCULAR Given 01/04/23 1524)   ondansetron (ZOFRAN ODT) rapid dissolve tablet (4 mg Oral Given 01/04/23 1524)     Clinical Impression   Contusion of right lower leg, initial encounter (Primary)   Contusion of hip, right       Disposition: Discharged

## 2023-01-19 ENCOUNTER — Emergency Department
Admission: EM | Admit: 2023-01-19 | Discharge: 2023-01-20 | Disposition: A | Discharge status: Other Acute Inpatient Hospital | Payer: Medicare HMO | Attending: Emergency Medicine | Admitting: Emergency Medicine

## 2023-01-19 ENCOUNTER — Emergency Department (HOSPITAL_BASED_OUTPATIENT_CLINIC_OR_DEPARTMENT_OTHER): Payer: Medicare HMO

## 2023-01-19 ENCOUNTER — Other Ambulatory Visit: Payer: Self-pay

## 2023-01-19 DIAGNOSIS — E785 Hyperlipidemia, unspecified: Secondary | ICD-10-CM | POA: Insufficient documentation

## 2023-01-19 DIAGNOSIS — R410 Disorientation, unspecified: Secondary | ICD-10-CM | POA: Insufficient documentation

## 2023-01-19 DIAGNOSIS — G9389 Other specified disorders of brain: Secondary | ICD-10-CM | POA: Insufficient documentation

## 2023-01-19 DIAGNOSIS — I252 Old myocardial infarction: Secondary | ICD-10-CM | POA: Insufficient documentation

## 2023-01-19 DIAGNOSIS — Z1152 Encounter for screening for COVID-19: Secondary | ICD-10-CM | POA: Insufficient documentation

## 2023-01-19 DIAGNOSIS — I214 Non-ST elevation (NSTEMI) myocardial infarction: Secondary | ICD-10-CM | POA: Insufficient documentation

## 2023-01-19 DIAGNOSIS — I10 Essential (primary) hypertension: Secondary | ICD-10-CM | POA: Insufficient documentation

## 2023-01-19 LAB — CBC WITH DIFF
BASOPHIL #: 0.02 10*3/uL (ref 0.00–0.30)
BASOPHIL %: 0 % (ref 0–3)
EOSINOPHIL #: 0.12 10*3/uL (ref 0.00–0.80)
EOSINOPHIL %: 2 % (ref 1–7)
HCT: 35.6 % — ABNORMAL LOW (ref 37.0–47.0)
HGB: 12.1 g/dL — ABNORMAL LOW (ref 12.5–16.0)
LYMPHOCYTE #: 1.49 10*3/uL (ref 1.10–5.00)
LYMPHOCYTE %: 21 % — ABNORMAL LOW (ref 25–45)
MCH: 32.9 pg — ABNORMAL HIGH (ref 27.0–32.0)
MCHC: 33.9 g/dL (ref 32.0–36.0)
MCV: 97.1 fL (ref 78.0–99.0)
MONOCYTE #: 0.47 10*3/uL (ref 0.00–1.30)
MONOCYTE %: 7 % (ref 0–12)
MPV: 7.4 fL (ref 7.4–10.4)
NEUTROPHIL #: 5.04 10*3/uL (ref 1.80–8.40)
NEUTROPHIL %: 71 % (ref 40–76)
PLATELETS: 236 10*3/uL (ref 140–440)
RBC: 3.67 10*6/uL — ABNORMAL LOW (ref 4.20–5.40)
RDW: 15 % — ABNORMAL HIGH (ref 11.6–14.8)
WBC: 7.1 10*3/uL (ref 4.0–10.5)

## 2023-01-19 LAB — COMPREHENSIVE METABOLIC PANEL, NON-FASTING
ALBUMIN/GLOBULIN RATIO: 0.9 (ref 0.8–1.4)
ALBUMIN: 3.2 g/dL — ABNORMAL LOW (ref 3.4–5.0)
ALKALINE PHOSPHATASE: 79 U/L (ref 46–116)
ALT (SGPT): 16 U/L (ref <=78)
ANION GAP: 13 mmol/L (ref 4–13)
AST (SGOT): 11 U/L — ABNORMAL LOW (ref 15–37)
BILIRUBIN TOTAL: 0.5 mg/dL (ref 0.2–1.0)
BUN/CREA RATIO: 12
BUN: 12 mg/dL (ref 7–18)
CALCIUM, CORRECTED: 9.4 mg/dL
CALCIUM: 8.8 mg/dL (ref 8.5–10.1)
CHLORIDE: 99 mmol/L (ref 98–107)
CO2 TOTAL: 23 mmol/L (ref 21–32)
CREATININE: 1.03 mg/dL — ABNORMAL HIGH (ref 0.55–1.02)
ESTIMATED GFR: 58 mL/min/{1.73_m2} — ABNORMAL LOW (ref >59)
GLOBULIN: 3.5
GLUCOSE: 163 mg/dL — ABNORMAL HIGH (ref 74–106)
OSMOLALITY, CALCULATED: 274 mOsm/kg (ref 270–290)
POTASSIUM: 3.1 mmol/L — ABNORMAL LOW (ref 3.5–5.1)
PROTEIN TOTAL: 6.7 g/dL (ref 6.4–8.2)
SODIUM: 135 mmol/L — ABNORMAL LOW (ref 136–145)

## 2023-01-19 LAB — TROPONIN-I: TROPONIN I: 17 ng/L — ABNORMAL HIGH (ref <15)

## 2023-01-19 LAB — D-DIMER: D-DIMER: 881 ng/mL FEU (ref 215–500)

## 2023-01-19 LAB — LIPASE: LIPASE: 21 U/L (ref 16–77)

## 2023-01-19 MED ORDER — SODIUM CHLORIDE 0.9 % IV BOLUS
1000.0000 mL | INJECTION | Status: AC
Start: 2023-01-19 — End: 2023-01-19
  Administered 2023-01-19: 0 mL via INTRAVENOUS
  Administered 2023-01-19: 1000 mL via INTRAVENOUS

## 2023-01-19 MED ORDER — ASPIRIN 81 MG CHEWABLE TABLET
324.0000 mg | CHEWABLE_TABLET | ORAL | Status: AC
Start: 2023-01-19 — End: 2023-01-19
  Administered 2023-01-19: 324 mg via ORAL

## 2023-01-19 MED ORDER — ASPIRIN 81 MG CHEWABLE TABLET
CHEWABLE_TABLET | ORAL | Status: AC
Start: 2023-01-19 — End: 2023-01-19
  Filled 2023-01-19: qty 4

## 2023-01-19 MED ORDER — LIDOCAINE HCL 2 % MUCOSAL SOLUTION
Status: AC
Start: 2023-01-19 — End: 2023-01-19
  Filled 2023-01-19: qty 15

## 2023-01-19 MED ORDER — IOHEXOL 350 MG IODINE/ML INTRAVENOUS SOLUTION
100.0000 mL | INTRAVENOUS | Status: AC
Start: 2023-01-19 — End: 2023-01-19
  Administered 2023-01-19: 100 mL via INTRAVENOUS

## 2023-01-19 MED ORDER — FENTANYL (PF) 50 MCG/ML INJECTION SOLUTION
75.0000 ug | INTRAMUSCULAR | Status: DC
Start: 2023-01-19 — End: 2023-01-19

## 2023-01-19 MED ORDER — NITROGLYCERIN 0.4 MG SUBLINGUAL TABLET
0.4000 mg | SUBLINGUAL_TABLET | SUBLINGUAL | Status: DC | PRN
Start: 2023-01-19 — End: 2023-01-20
  Administered 2023-01-19 (×3): 0.4 mg via SUBLINGUAL

## 2023-01-19 MED ORDER — HYOSCYAMINE 0.125 MG/5 ML ORAL ELIXIR
ORAL_SOLUTION | ORAL | Status: AC
Start: 2023-01-19 — End: 2023-01-19
  Filled 2023-01-19: qty 5

## 2023-01-19 MED ORDER — ALUMINUM-MAG HYDROXIDE-SIMETHICONE 200 MG-200 MG-20 MG/5 ML ORAL SUSP
30.0000 mL | Freq: Once | ORAL | Status: AC
Start: 2023-01-19 — End: 2023-01-19
  Administered 2023-01-19: 30 mL via ORAL

## 2023-01-19 MED ORDER — LIDOCAINE HCL 2 % MUCOSAL SOLUTION
15.0000 mL | Freq: Once | Status: AC
Start: 2023-01-19 — End: 2023-01-19
  Administered 2023-01-19: 15 mL via ORAL

## 2023-01-19 MED ORDER — ALUMINUM-MAG HYDROXIDE-SIMETHICONE 200 MG-200 MG-20 MG/5 ML ORAL SUSP
ORAL | Status: AC
Start: 2023-01-19 — End: 2023-01-19
  Filled 2023-01-19: qty 30

## 2023-01-19 MED ORDER — MORPHINE 2 MG/ML INJECTION WRAPPER
2.0000 mg | INJECTION | INTRAMUSCULAR | Status: AC
Start: 2023-01-19 — End: 2023-01-19
  Administered 2023-01-19: 2 mg via INTRAVENOUS

## 2023-01-19 MED ORDER — NITROGLYCERIN 0.4 MG SUBLINGUAL TABLET
SUBLINGUAL_TABLET | SUBLINGUAL | Status: AC
Start: 2023-01-19 — End: 2023-01-19
  Filled 2023-01-19: qty 1

## 2023-01-19 MED ORDER — HYOSCYAMINE 0.125 MG/5 ML ORAL ELIXIR
10.0000 mL | ORAL_SOLUTION | Freq: Once | ORAL | Status: AC
Start: 2023-01-19 — End: 2023-01-19
  Administered 2023-01-19: 10 mL via ORAL

## 2023-01-19 MED ORDER — MORPHINE 2 MG/ML INJECTION WRAPPER
INJECTION | INTRAMUSCULAR | Status: AC
Start: 2023-01-19 — End: 2023-01-19
  Filled 2023-01-19: qty 1

## 2023-01-19 NOTE — ED Provider Notes (Signed)
[facility], [facility] Emergency Department  ED Primary Provider Note  HPI:  Judy Hicks is a 72 y.o. female     Patient complains of severe chest pain sternal started about 45 minutes ago.  No radiation.  No inciting exacerbating or alleviating factors.  She was eating at the time the pain started.  She states he does have a history of MI.  Unclear whether she has stents.  Her history somewhat limited by presentation.  Chart review shows history of MI with hypertension hyperlipidemia as known cardiac risk factors.  Patient accompanied by husband.  She was full code.    ROS review and negative aside from stated in HPI.    Physical Exam:  ED Triage Vitals [01/19/23 2123]   BP (Non-Invasive) (!) 159/101   Heart Rate 89   Respiratory Rate 19   Temperature 37.2 C (98.9 F)   SpO2 96 %   Weight 90.7 kg (200 lb)   Height 1.651 m (5\' 5" )     Patient appears uncomfortable.  Clutching chest.  Not diaphoretic.  Patient awake alert oriented x3.  Mood is appropriate.  Pupils 3 mm equal round reactive.  Extraocular movements are intact.  Oropharynx is clear.  Mucous membranes moist.  Trachea midline.  Neck is supple.  Heart has regular rate and rhythm without significant murmurs rubs or gallops.  Lungs are clear to auscultation.  Abdomen soft nontender, nondistended.  Moving all extremities without difficulty.  No rash no edema.      Patient data:  Labs Ordered/Reviewed   COMPREHENSIVE METABOLIC PANEL, NON-FASTING - Abnormal; Notable for the following components:       Result Value    SODIUM 135 (*)     POTASSIUM 3.1 (*)     CREATININE 1.03 (*)     ESTIMATED GFR 58 (*)     ALBUMIN 3.2 (*)     GLUCOSE 163 (*)     AST (SGOT) 11 (*)     All other components within normal limits    Narrative:     Estimated Glomerular Filtration Rate (eGFR) is calculated using the CKD-EPI (2021) equation, intended for patients 54 years of age and older. If gender is not documented or "unknown", there will be no  eGFR calculation.   TROPONIN-I - Abnormal; Notable for the following components:    TROPONIN I 17 (*)     All other components within normal limits    Narrative:     Values received on females ranging between 12-15 ng/L MUST include the next serial troponin to review changes in the delta differences as the reference range for the Access II chemistry analyzer is lower than the established reference range.     TROPONIN-I - Abnormal; Notable for the following components:    TROPONIN I 48 (*)     All other components within normal limits    Narrative:     Values received on females ranging between 12-15 ng/L MUST include the next serial troponin to review changes in the delta differences as the reference range for the Access II chemistry analyzer is lower than the established reference range.     CBC WITH DIFF - Abnormal; Notable for the following components:    RBC 3.67 (*)     HGB 12.1 (*)     HCT 35.6 (*)     MCH 32.9 (*)     RDW 15.0 (*)     LYMPHOCYTE % 21 (*)  All other components within normal limits   D-DIMER - Abnormal; Notable for the following components:    D-DIMER 881 (*)     All other components within normal limits    Narrative:     D-Dimers are reported in FEU per ng/mL.    IF PATIENT IS EXHIBITING SYMPTOMS DVT/PE, THIS D-DIMER RESULT MAY INDICATE A NEED FOR FURTHER TESTING FOR THESE CONDITIONS. IF PATIENT IS SUSPECTED OF DIC AND SYMPTOMS WORSEN OR PERSIST, A REPEAT DIC WORKUP SHOULD BE CONSIDERED.    NOTE: ALTHOUGH THE NORMAL RANGE FOR THIS TEST IS 215-500 ng/mL FEU, LITERATURE RECOMMENDS FURTHER TESTING FOR ANY RESULT >500ng/mL FEU.    A cut off value of 500ng/mL FEU or below can be used as an aid in the diagnosis of Thromboembolism when used in conjunction with the patient's medical history, clinical presentation and other findings. Results of 500ng/mL FEU or below have a negative predictive value of 100%.     TROPONIN-I - Abnormal; Notable for the following components:    TROPONIN I 135 (*)      All other components within normal limits    Narrative:     Values received on females ranging between 12-15 ng/L MUST include the next serial troponin to review changes in the delta differences as the reference range for the Access II chemistry analyzer is lower than the established reference range.     LIPASE - Normal   PTT (PARTIAL THROMBOPLASTIN TIME) - Normal   COVID-19, FLU A/B, RSV RAPID BY PCR - Normal    Narrative:     Results are for the simultaneous qualitative identification of SARS-CoV-2 (formerly 2019-nCoV), Influenza A, Influenza B, and RSV RNA. These etiologic agents are generally detectable in nasopharyngeal and nasal swabs during the ACUTE PHASE of infection. Hence, this test is intended to be performed on respiratory specimens collected from individuals with signs and symptoms of upper respiratory tract infection who meet Centers for Disease Control and Prevention (CDC) clinical and/or epidemiological criteria for Coronavirus Disease 2019 (COVID-19) testing. CDC COVID-19 criteria for testing on human specimens is available at [facility] webpage information for Healthcare Professionals: Coronavirus Disease 2019 (COVID-19) ([email or web address]).     False-negative results may occur if the virus has genomic mutations, insertions, deletions, or rearrangements or if performed very early in the course of illness. Otherwise, negative results indicate virus specific RNA targets are not detected, however negative results do not preclude SARS-CoV-2 infection/COVID-19, Influenza, or Respiratory syncytial virus infection. Results should not be used as the sole basis for patient management decisions. Negative results must be combined with clinical observations, patient history, and epidemiological information. If upper respiratory tract infection is still suspected based on exposure history together with other clinical findings, re-testing should be considered.    Test  methodology:   Cepheid Xpert Xpress SARS-CoV-2/Flu/RSV Assay real-time polymerase chain reaction (RT-PCR) test on the GeneXpert Dx and Xpert Xpress systems.   CBC/DIFF    Narrative:     The following orders were created for panel order CBC/DIFF.  Procedure                               Abnormality         Status                     ---------                               -----------         ------  CBC WITH QMVH[[id number]]                Abnormal            Final result                 Please view results for these tests on the individual orders.     CT BRAIN WO IV CONTRAST   Final Result by Edi, Radresults In (09/15 2310)   CHRONIC CHANGES.  NO ACUTE FINDINGS.          One or more dose reduction techniques were used (e.g., Automated exposure control, adjustment of the mA and/or kV according to patient size, use of iterative reconstruction technique).         Radiologist location ID: [id number]         CT ANGIO CHEST ABDOMEN PELVIS W IV CONTRAST   Final Result by Edi, Radresults In (09/15 2227)   NO ACUTE VASCULAR ABNORMALITIES OR HIGH-GRADE STENOSIS      NO ACUTE MESENTERIC INFLAMMATION, FREE FLUID OR FREE AIR IN THE ABDOMEN   NO ACUTE PULMONARY DISEASE         One or more dose reduction techniques were used (e.g., Automated exposure control, adjustment of the mA and/or kV according to patient size, use of iterative reconstruction technique).         Radiologist location ID: [id number]         XR AP MOBILE CHEST   Final Result by Edi, Radresults In (09/15 2147)   NO ACUTE FINDINGS.            Radiologist location ID: [id number]           EKG read by me shows a sinus rhythm with a rate of 90, normal axis, QTC 479.  I do not appreciate significant ST changes.      MDM:    Chest pain.  DX includes acute coronary syndrome arrhythmia concern for possible dissection.  Blood pressure 159/109.  Initially no neurologic deficits.  EKG nonspecific initial troponin 17 and quickly rose to 135.   Potassium 3.1..  Patient had a CTA of the chest abdomen and pelvis which showed no acute vascular abnormality high-grade stenosis or central pulmonary film lying defects.  I did review these findings with the patient and answered her questions.  She did become very mildly confused with non lateralizing symptoms is detailed below.  She did get a CT of the head which did not reveal an acute abnormality.  Her pain was controlled with nitro drip.  Started on heparin for non ST elevation myocardial infarction.  She does have an allergy to statin.  Plan admission      ED Course as of 01/20/23 0947   Wynelle Link Jan 19, 2023   2257 On reassessed has been patient is somewhat confused she was unclear as to the date.  No lateralizing weakness dysarthria or aphasia.  Plan CT head.   Mon Jan 20, 2023   0225 I did speak with [facility] hospitalist Maidaa about the patient.  He was states that she was appropriate admission however there is no bed availability.  [place] ED is currently a capacity and by protocol recommendation is for transfer.     3295 I did speak with hospitalist [facility] who kindly accepted for transfer.  Note patient claims allergy to atorvastatin causing shortness of breath     Critical Care Time:  Total critical care time spent in direct care of  this patient at high risk based on presenting history/exam/and complaint, including initial evaluation and stabilization, review of data, re-examination, discussion with admitting and consulting services to arrange definitive care, and exclusive of any procedures performed, was 1.5 hour.     Admitted  Clinical Impression   Non-ST elevation myocardial infarction (NSTEMI) (CMS HCC) (Primary)   Confused     Medications Administered in the ED   aspirin chewable tablet 324 mg (324 mg Oral Given 01/19/23 2137)   aluminum-magnesium hydroxide-simethicone (MAG-AL PLUS) 200-200-20 mg per 5 mL oral liquid (30 mL Oral Given 01/19/23 2137)     And   hyoscyamine  (HYOSYNE) 0.125 mg per 5 mL oral liquid (10 mL Oral Given 01/19/23 2138)     And   lidocaine (XYLOCAINE) 2% oral topical viscous solution (15 mL Oral Given 01/19/23 2137)   morphine 2 mg/mL injection (2 mg Intravenous Given 01/19/23 2146)   iohexol (OMNIPAQUE 350) infusion (100 mL Intravenous Given 01/19/23 2204)   NS bolus infusion 1,000 mL (0 mL Intravenous Stopped 01/19/23 2338)   heparin 5,000 units/mL initial IV BOLUS (has no administration in time range)        Current Discharge Medication List        CONTINUE these medications - NO CHANGES were made during your visit.        Details   escitalopram oxalate 10 mg Tablet  Commonly known as: LEXAPRO   10 mg, Oral, DAILY  Refills: 0     furosemide 20 mg Tablet  Commonly known as: LASIX   20 mg, Oral, DAILY  Refills: 0     gabapentin 100 mg Capsule  Commonly known as: NEURONTIN   100 mg, Oral  Refills: 0     isosorbide mononitrate 60 mg Tablet Sustained Release 24 hr  Commonly known as: IMDUR   60 mg, Oral, EVERY MORNING  Refills: 0     levothyroxine 25 mcg Tablet  Commonly known as: SYNTHROID   25 mcg, Oral, EVERY MORNING  Refills: 0     losartan 100 mg Tablet  Commonly known as: COZAAR   100 mg, Oral, DAILY  Refills: 0     meclizine 25 mg Tablet  Commonly known as: ANTIVERT   25 mg, Oral, EVERY 12 HOURS PRN  Qty: 14 Tablet  Refills: 0     metoprolol succinate 25 mg Tablet Sustained Release 24 hr  Commonly known as: TOPROL-XL   25 mg, Oral, DAILY  Refills: 0     mirabegron 25 mg Tablet Sustained Release 24 hr  Commonly known as: MYRBETRIQ   25 mg, Oral, DAILY  Refills: 0     omeprazole 20 mg Capsule, Delayed Release(E.C.)  Commonly known as: PRILOSEC   20 mg, Oral, DAILY  Refills: 0     ondansetron 4 mg Tablet  Commonly known as: ZOFRAN   4 mg, Oral, EVERY 8 HOURS PRN  Refills: 0     rOPINIRole 1 mg Tablet  Commonly known as: REQUIP   1 mg, Oral, 3 TIMES DAILY  Refills: 0     traMADol-acetaminophen 37.5-325 mg Tablet  Commonly known as: ULTRACET   1 Tablet, Oral, EVERY  6 HOURS PRN  Qty: 18 Tablet  Refills: 0     traZODone 50 mg Tablet  Commonly known as: DESYREL   50 mg, Oral, NIGHTLY  Refills: 0

## 2023-01-19 NOTE — ED Triage Notes (Signed)
Pt rfeports 45 min of midsternal CP , SOB . Prior Hx of MI

## 2023-01-19 NOTE — ED Nurses Note (Signed)
This nurse performed NIH stroke scale on patient. Stroke scale is negative but patient still presents with confusion

## 2023-01-19 NOTE — ED Nurses Note (Signed)
This nurse in to check on patient. While in room patient is confused and cannot remember what is going on. Patient does not remember going to CT or what year it is. Dr.Oteng informed and in room to reassess patient.

## 2023-01-20 DIAGNOSIS — I493 Ventricular premature depolarization: Secondary | ICD-10-CM

## 2023-01-20 LAB — ECG 12 LEAD
Atrial Rate: 90 {beats}/min
Calculated P Axis: 11 degrees
Calculated R Axis: 24 degrees
Calculated T Axis: 24 degrees
PR Interval: 144 ms
QRS Duration: 78 ms
QT Interval: 392 ms
QTC Calculation: 479 ms
Ventricular rate: 90 {beats}/min

## 2023-01-20 LAB — TROPONIN-I
TROPONIN I: 135 ng/L — ABNORMAL HIGH (ref <15)
TROPONIN I: 48 ng/L — ABNORMAL HIGH (ref <15)

## 2023-01-20 LAB — COVID-19, FLU A/B, RSV RAPID BY PCR
INFLUENZA VIRUS TYPE A: NOT DETECTED
INFLUENZA VIRUS TYPE B: NOT DETECTED
RESPIRATORY SYNCTIAL VIRUS (RSV): NOT DETECTED
SARS-CoV-2: NOT DETECTED

## 2023-01-20 LAB — PTT (PARTIAL THROMBOPLASTIN TIME): APTT: 33 seconds (ref 25.0–38.0)

## 2023-01-20 MED ORDER — HEPARIN (PORCINE) 25,000 UNIT/250 ML (100 UNIT/ML) IN DEXTROSE 5 % IV
12.0000 [IU]/kg/h | INTRAVENOUS | Status: DC
Start: 2023-01-20 — End: 2023-01-20
  Administered 2023-01-20: 12 [IU]/kg/h via INTRAVENOUS

## 2023-01-20 MED ORDER — ROPINIROLE 1 MG TABLET
1.0000 mg | ORAL_TABLET | Freq: Every evening | ORAL | Status: DC
Start: 2023-01-20 — End: 2023-01-20
  Administered 2023-01-20: 1 mg via ORAL

## 2023-01-20 MED ORDER — NITROGLYCERIN 50 MG/250 ML (200 MCG/ML) IN 5 % DEXTROSE INTRAVENOUS
INTRAVENOUS | Status: AC
Start: 2023-01-20 — End: 2023-01-20
  Filled 2023-01-20: qty 250

## 2023-01-20 MED ORDER — HEPARIN (PORCINE) 5,000 UNITS/ML BOLUS
60.0000 [IU]/kg | Freq: Once | INTRAMUSCULAR | Status: AC
Start: 2023-01-20 — End: 2023-01-20
  Administered 2023-01-20: 4000 [IU] via INTRAVENOUS

## 2023-01-20 MED ORDER — HEPARIN (PORCINE) 5,000 UNIT/ML INJECTION SOLUTION
INTRAMUSCULAR | Status: AC
Start: 2023-01-20 — End: 2023-01-20
  Filled 2023-01-20: qty 1

## 2023-01-20 MED ORDER — NITROGLYCERIN 50 MG/250 ML (200 MCG/ML) IN 5 % DEXTROSE INTRAVENOUS
5.0000 ug/min | INTRAVENOUS | Status: DC
Start: 2023-01-20 — End: 2023-01-20
  Administered 2023-01-20: 5 ug/min via INTRAVENOUS
  Administered 2023-01-20: 15 ug/min via INTRAVENOUS
  Administered 2023-01-20: 10 ug/min via INTRAVENOUS
  Administered 2023-01-20: 20 ug/min via INTRAVENOUS

## 2023-01-20 MED ORDER — MORPHINE 2 MG/ML INJECTION WRAPPER
2.0000 mg | INJECTION | INTRAMUSCULAR | Status: AC
Start: 2023-01-20 — End: 2023-01-20
  Administered 2023-01-20: 2 mg via INTRAVENOUS

## 2023-01-20 MED ORDER — HEPARIN (PORCINE) 25,000 UNIT/250 ML (100 UNIT/ML) IN DEXTROSE 5 % IV
INTRAVENOUS | Status: AC
Start: 2023-01-20 — End: 2023-01-20
  Filled 2023-01-20: qty 250

## 2023-01-20 MED ORDER — ROPINIROLE 1 MG TABLET
ORAL_TABLET | ORAL | Status: AC
Start: 2023-01-20 — End: 2023-01-20
  Filled 2023-01-20: qty 1

## 2023-01-20 MED ORDER — MORPHINE 2 MG/ML INJECTION WRAPPER
INJECTION | INTRAMUSCULAR | Status: AC
Start: 2023-01-20 — End: 2023-01-20
  Filled 2023-01-20: qty 1

## 2023-01-20 NOTE — ED Nurses Note (Signed)
Report called for pt transfer to [facility] PCU at this time.

## 2023-01-20 NOTE — ED Nurses Note (Signed)
Nitro drip started per [facility] order. Patient is rating her pain currently a 4 mid sternal on the 0-10 numeric scale.

## 2023-01-20 NOTE — ED Nurses Note (Signed)
PT assisted to bedpan again and given additional pain meds for restless leg (chronic condition) and still present chest pains and nitro drip increased. [facility] arrived and transporting pt.

## 2023-01-20 NOTE — ED Nurses Note (Signed)
EMS leaving with pt at this time. PT alert/o x4 at time of transport with chest pains minimal 1-2/10 prior to leaving ER.

## 2023-01-20 NOTE — ED Nurses Note (Signed)
PT assisted to bedside toilet for urine sample. States pain increased again after getting back in bed at a 5/10, so MD informed and nitro increased per verbal order.

## 2023-01-20 NOTE — ED Nurses Note (Signed)
Family called and updated on POC and transfer per pt request. Pts belongings placed in bag and her husband did take her phone and purse when he left to go home (per previous nurse Morrie Sheldon).

## 2023-01-23 NOTE — Progress Notes (Signed)
CARE TEAM:  Patient Care Team:  Edison Simon., NP as PCP - General (Nurse Practitioner)    ASSESSMENT  Diagnoses:   1. Contusion of right ankle, initial encounter       Patient Active Problem List   Diagnosis   . Acquired hypothyroidism   . Essential hypertension   . S/P cardiac cath       PLAN  Treatment Plan: I have discussed with Alfreeda that she has sustained a right ankle contusion secondary to her recent acute and complicated injury. The patient was counseled this will respond well to conservative treatment. She was advised to begin weaning out of her walker boot. She was also counseled to continue use of topical anti-inflammatories and ice. I will see her back as needed at which time an MRI may be considered.   Orders Placed This Encounter   . BP Patient Education      Follow-up: Return if symptoms worsen or fail to improve.      HISTORY OF PRESENT ILLNESS  Chief Complaint: Pain, Follow-up, and Injury of the Right Ankle (C/O patient presents for OOC follow up; was swinging on porch swing, it fell, causing the swing to land on her ankle;  was seen on OOC 9/9 , is having continued pain, states she has been in the hospital with MI (released last night [place]) has not been wearing boot,  pain level is at 5)   Age: 72 y.o.    Sex: female   Hand-dominance: Right    History of present illness: Ms. Oroark presents today for evaluation of her right ankle. The patient was seen by Octavio Manns, PA-C on 01/13/23 at which time she reported she was sitting on a swing when the swing fell and hit her right ankle. She reported numbness and swelling. The patient was placed in a short walker boot and was advised to utilize RICE protocol and anti-inflammatories.     Today the patient reports continued right ankle pain.  She has not worn her boot consistently however she presents in this today. She rates her pain5 /10 at today's visit.   OBJECTIVE  Constitutional:  No acute distress. Her body mass index is 33.28 kg/m.   Eyes:   Sclera are nonicteric.  Respiratory:  No labored breathing. Clear to auscultation.  Cardiovascular:  No marked edema. Regular rate and rhythm.  Skin:  No marked skin ulcers.  Neurological:  No marked sensory loss noted.  Psychiatric: Alert and oriented x3.    Musculoskeletal   Right Foot/Ankle Exam:  Gait    Nonantalgic  Weight bearing alignment Plantigrade  Deformity   None  Masses   None  Absent parts   None  Skin    Superficial laceration about Achilles    Swelling:   Ankle    None  Hindfoot   None  Midfoot   None  Forefoot   + dorsum    Medial arch   Normal  Hindfoot   Normal  Tenderness   1-4 metatarsal regions  Calluses/Corns  None    Range of motion:   Ankle:     Plantarflexion WNL    Dorsiflexion WNL    Eversion WNL    Inversion WNL   Subtalar  WNL   Phalangeal  WNL    Neurovascular:    Motor   5/5   Sensory  WNL touch/pin-prick   Peripheral pulses 2+ and symmetrical DP and PT    Hammertoe deformity  None  Hallux valgus  Absent    Ankle Stability:   Plantar flexion/inversion stress WNL   Anterior drawer   WNL    Foot biomechanics  WNL  Achilles tendon  WNL  Posterior tibial competency Able to do single and double heel rise    Other tests: none      IMAGING / STUDIES  X-rays of the  Right ankle, 3 views, performed at [facility] on 01/13/23 were reviewed.  Impression: There is soft tissue swelling noted dorsally about the foot. No acute fractures or dislocations. No lytic or blastic lesions. Skeletally mature. No orthopedic hardware. No effusion.   PROCEDURES  Procedures      I, Don Broach, MD, personally, performed the services described in this documentation, as scribed in my presence, and it is both accurate and complete.  Scribed by: Paulene Floor

## 2023-01-29 ENCOUNTER — Encounter (HOSPITAL_COMMUNITY): Payer: Self-pay | Admitting: Family Medicine

## 2023-01-29 ENCOUNTER — Ambulatory Visit (HOSPITAL_COMMUNITY): Payer: Medicare HMO

## 2023-01-29 ENCOUNTER — Observation Stay
Admission: EM | Admit: 2023-01-29 | Discharge: 2023-02-01 | Disposition: A | Discharge status: Home / Self Care | Payer: Medicare HMO | Attending: Internal Medicine | Admitting: Internal Medicine

## 2023-01-29 ENCOUNTER — Other Ambulatory Visit: Payer: Self-pay

## 2023-01-29 ENCOUNTER — Observation Stay (HOSPITAL_COMMUNITY): Payer: Medicare HMO | Admitting: INTERNAL MEDICINE

## 2023-01-29 ENCOUNTER — Emergency Department (HOSPITAL_COMMUNITY): Payer: Medicare HMO

## 2023-01-29 DIAGNOSIS — I252 Old myocardial infarction: Secondary | ICD-10-CM | POA: Insufficient documentation

## 2023-01-29 DIAGNOSIS — E785 Hyperlipidemia, unspecified: Secondary | ICD-10-CM | POA: Insufficient documentation

## 2023-01-29 DIAGNOSIS — R9431 Abnormal electrocardiogram [ECG] [EKG]: Secondary | ICD-10-CM | POA: Insufficient documentation

## 2023-01-29 DIAGNOSIS — G629 Polyneuropathy, unspecified: Secondary | ICD-10-CM | POA: Insufficient documentation

## 2023-01-29 DIAGNOSIS — R079 Chest pain, unspecified: Principal | ICD-10-CM | POA: Insufficient documentation

## 2023-01-29 DIAGNOSIS — I200 Unstable angina: Secondary | ICD-10-CM | POA: Diagnosis present

## 2023-01-29 DIAGNOSIS — I440 Atrioventricular block, first degree: Secondary | ICD-10-CM | POA: Insufficient documentation

## 2023-01-29 DIAGNOSIS — R32 Unspecified urinary incontinence: Secondary | ICD-10-CM | POA: Insufficient documentation

## 2023-01-29 DIAGNOSIS — E039 Hypothyroidism, unspecified: Secondary | ICD-10-CM | POA: Insufficient documentation

## 2023-01-29 DIAGNOSIS — F32A Depression, unspecified: Secondary | ICD-10-CM | POA: Insufficient documentation

## 2023-01-29 DIAGNOSIS — R0609 Other forms of dyspnea: Secondary | ICD-10-CM

## 2023-01-29 DIAGNOSIS — M329 Systemic lupus erythematosus, unspecified: Secondary | ICD-10-CM | POA: Insufficient documentation

## 2023-01-29 DIAGNOSIS — R519 Headache, unspecified: Secondary | ICD-10-CM

## 2023-01-29 DIAGNOSIS — G4730 Sleep apnea, unspecified: Secondary | ICD-10-CM | POA: Insufficient documentation

## 2023-01-29 DIAGNOSIS — K219 Gastro-esophageal reflux disease without esophagitis: Secondary | ICD-10-CM | POA: Insufficient documentation

## 2023-01-29 DIAGNOSIS — I10 Essential (primary) hypertension: Secondary | ICD-10-CM | POA: Insufficient documentation

## 2023-01-29 DIAGNOSIS — I2510 Atherosclerotic heart disease of native coronary artery without angina pectoris: Secondary | ICD-10-CM | POA: Diagnosis present

## 2023-01-29 DIAGNOSIS — I25110 Atherosclerotic heart disease of native coronary artery with unstable angina pectoris: Secondary | ICD-10-CM | POA: Insufficient documentation

## 2023-01-29 HISTORY — DX: Narcolepsy without cataplexy: G47.419

## 2023-01-29 LAB — COMPREHENSIVE METABOLIC PANEL, NON-FASTING
ALBUMIN/GLOBULIN RATIO: 1.6 — ABNORMAL HIGH (ref 0.8–1.4)
ALBUMIN: 4.3 g/dL (ref 3.5–5.7)
ALKALINE PHOSPHATASE: 63 U/L (ref 34–104)
ALT (SGPT): 22 U/L (ref 7–52)
ANION GAP: 8 mmol/L (ref 4–13)
AST (SGOT): 34 U/L (ref 13–39)
BILIRUBIN TOTAL: 0.6 mg/dL (ref 0.3–1.0)
BUN/CREA RATIO: 14 (ref 6–22)
BUN: 13 mg/dL (ref 7–25)
CALCIUM, CORRECTED: 9 mg/dL (ref 8.9–10.8)
CALCIUM: 9.2 mg/dL (ref 8.6–10.3)
CHLORIDE: 106 mmol/L (ref 98–107)
CO2 TOTAL: 26 mmol/L (ref 21–31)
CREATININE: 0.92 mg/dL (ref 0.60–1.30)
ESTIMATED GFR: 66 mL/min/{1.73_m2} (ref >59)
GLOBULIN: 2.7 — ABNORMAL LOW (ref 2.9–5.4)
GLUCOSE: 107 mg/dL (ref 74–109)
OSMOLALITY, CALCULATED: 280 mOsm/kg (ref 270–290)
POTASSIUM: 3.9 mmol/L (ref 3.5–5.1)
PROTEIN TOTAL: 7 g/dL (ref 6.4–8.9)
SODIUM: 140 mmol/L (ref 136–145)

## 2023-01-29 LAB — CBC WITH DIFF
BASOPHIL #: 0 10*3/uL (ref 0.00–0.10)
BASOPHIL %: 1 % (ref 0–1)
EOSINOPHIL #: 0.3 10*3/uL (ref 0.00–0.50)
EOSINOPHIL %: 5 % (ref 1–7)
HCT: 37.6 % (ref 31.2–41.9)
HGB: 12.8 g/dL (ref 10.9–14.3)
LYMPHOCYTE #: 1.9 10*3/uL (ref 1.00–3.00)
LYMPHOCYTE %: 35 % (ref 16–44)
MCH: 32.3 pg (ref 24.7–32.8)
MCHC: 33.9 g/dL (ref 32.3–35.6)
MCV: 95.3 fL (ref 75.5–95.3)
MONOCYTE #: 0.4 10*3/uL (ref 0.30–1.00)
MONOCYTE %: 8 % (ref 5–13)
MPV: 7.5 fL — ABNORMAL LOW (ref 7.9–10.8)
NEUTROPHIL #: 2.9 10*3/uL (ref 1.85–7.80)
NEUTROPHIL %: 52 % (ref 43–77)
PLATELETS: 295 10*3/uL (ref 140–440)
RBC: 3.95 10*6/uL (ref 3.63–4.92)
RDW: 13.7 % (ref 12.3–17.7)
WBC: 5.6 10*3/uL (ref 3.8–11.8)

## 2023-01-29 LAB — PT/INR
INR: 1.01 (ref 0.84–1.10)
PROTHROMBIN TIME: 11.8 seconds (ref 9.8–12.7)

## 2023-01-29 LAB — TROPONIN-I
TROPONIN I: 16 ng/L — ABNORMAL HIGH (ref <15)
TROPONIN I: 15 ng/L — ABNORMAL HIGH (ref <15)
TROPONIN I: 15 ng/L — ABNORMAL HIGH (ref <15)

## 2023-01-29 LAB — B-TYPE NATRIURETIC PEPTIDE (BNP),PLASMA: BNP: 118 pg/mL — ABNORMAL HIGH (ref 1–100)

## 2023-01-29 LAB — D-DIMER: D-DIMER: 999 ng/mL FEU (ref 215–500)

## 2023-01-29 LAB — PTT (PARTIAL THROMBOPLASTIN TIME): APTT: 31 seconds (ref 25.0–38.0)

## 2023-01-29 MED ORDER — ONDANSETRON HCL (PF) 4 MG/2 ML INJECTION SOLUTION
4.0000 mg | Freq: Four times a day (QID) | INTRAMUSCULAR | Status: AC | PRN
Start: 2023-01-29 — End: ?

## 2023-01-29 MED ORDER — TRAZODONE 50 MG TABLET
50.0000 mg | ORAL_TABLET | Freq: Every evening | ORAL | Status: AC
Start: 2023-01-29 — End: ?
  Administered 2023-01-29: 50 mg via ORAL

## 2023-01-29 MED ORDER — IOHEXOL 350 MG IODINE/ML INTRAVENOUS SOLUTION
50.0000 mL | INTRAVENOUS | Status: AC
Start: 2023-01-29 — End: 2023-01-29
  Administered 2023-01-29: 100 mL via INTRAVENOUS

## 2023-01-29 MED ORDER — NITROGLYCERIN 0.4 MG SUBLINGUAL TABLET
SUBLINGUAL_TABLET | SUBLINGUAL | Status: AC
Start: 2023-01-29 — End: 2023-01-29
  Filled 2023-01-29: qty 3

## 2023-01-29 MED ORDER — ASPIRIN 81 MG CHEWABLE TABLET
CHEWABLE_TABLET | ORAL | Status: AC
Start: 2023-01-29 — End: 2023-01-29
  Filled 2023-01-29: qty 1

## 2023-01-29 MED ORDER — ATORVASTATIN 40 MG TABLET
40.0000 mg | ORAL_TABLET | Freq: Every evening | ORAL | Status: AC
Start: 2023-01-30 — End: ?

## 2023-01-29 MED ORDER — NITROGLYCERIN 0.4 MG SUBLINGUAL TABLET
0.4000 mg | SUBLINGUAL_TABLET | SUBLINGUAL | Status: AC
Start: 2023-01-29 — End: 2023-01-29
  Administered 2023-01-29: 0.4 mg via SUBLINGUAL

## 2023-01-29 MED ORDER — TRAZODONE 50 MG TABLET
ORAL_TABLET | ORAL | Status: AC
Start: 2023-01-29 — End: 2023-01-29
  Filled 2023-01-29: qty 1

## 2023-01-29 MED ORDER — METOPROLOL SUCCINATE ER 25 MG TABLET,EXTENDED RELEASE 24 HR
25.0000 mg | ORAL_TABLET | Freq: Every day | ORAL | Status: DC
Start: 2023-01-30 — End: 2023-02-01
  Administered 2023-01-30 – 2023-01-31 (×2): 25 mg via ORAL
  Filled 2023-01-29 (×3): qty 1

## 2023-01-29 MED ORDER — ENOXAPARIN 40 MG/0.4 ML SUBCUTANEOUS SYRINGE
40.0000 mg | INJECTION | SUBCUTANEOUS | Status: AC
Start: 2023-01-30 — End: ?
  Administered 2023-01-30 – 2023-01-31 (×2): 40 mg via SUBCUTANEOUS
  Filled 2023-01-29 (×3): qty 0.4

## 2023-01-29 MED ORDER — MECLIZINE 25 MG TABLET
12.5000 mg | ORAL_TABLET | Freq: Three times a day (TID) | ORAL | Status: AC | PRN
Start: 2023-01-29 — End: ?

## 2023-01-29 MED ORDER — ISOSORBIDE MONONITRATE ER 60 MG TABLET,EXTENDED RELEASE 24 HR
60.0000 mg | ORAL_TABLET | Freq: Every day | ORAL | Status: DC
Start: 2023-01-30 — End: 2023-02-01
  Administered 2023-01-30 – 2023-01-31 (×2): 60 mg via ORAL
  Filled 2023-01-29 (×3): qty 1

## 2023-01-29 MED ORDER — SODIUM CHLORIDE 0.9 % INTRAVENOUS SOLUTION
INTRAVENOUS | Status: AC
Start: 2023-01-30 — End: ?
  Administered 2023-01-31: 0 mL via INTRAVENOUS

## 2023-01-29 MED ORDER — ALUMINUM-MAG HYDROXIDE-SIMETHICONE 200 MG-200 MG-20 MG/5 ML ORAL SUSP
30.0000 mL | ORAL | Status: AC | PRN
Start: 2023-01-29 — End: ?

## 2023-01-29 MED ORDER — ACETAMINOPHEN 325 MG TABLET
650.0000 mg | ORAL_TABLET | ORAL | Status: AC | PRN
Start: 2023-01-29 — End: ?

## 2023-01-29 MED ORDER — SODIUM CHLORIDE 0.9 % (FLUSH) INJECTION SYRINGE
3.0000 mL | INJECTION | INTRAMUSCULAR | Status: AC | PRN
Start: 2023-01-29 — End: ?

## 2023-01-29 MED ORDER — SODIUM CHLORIDE 0.9 % (FLUSH) INJECTION SYRINGE
3.0000 mL | INJECTION | Freq: Three times a day (TID) | INTRAMUSCULAR | Status: AC
Start: 2023-01-29 — End: ?
  Administered 2023-01-29 – 2023-01-30 (×3): 3 mL
  Administered 2023-01-30 – 2023-01-31 (×2): 0 mL
  Administered 2023-01-31 (×2): 3 mL

## 2023-01-29 MED ORDER — ASPIRIN 81 MG CHEWABLE TABLET
324.0000 mg | CHEWABLE_TABLET | ORAL | Status: AC
Start: 2023-01-29 — End: 2023-01-29
  Administered 2023-01-29: 162 mg via ORAL

## 2023-01-29 MED ORDER — ASPIRIN 81 MG CHEWABLE TABLET
81.0000 mg | CHEWABLE_TABLET | Freq: Every day | ORAL | Status: DC
Start: 2023-01-30 — End: 2023-02-01
  Administered 2023-01-30 – 2023-01-31 (×2): 81 mg via ORAL
  Filled 2023-01-29 (×3): qty 1

## 2023-01-29 NOTE — ED Nurses Note (Signed)
PATIENT REQUESTED PUREWICK TO BE PLACED SO PATIENT COULD REST. PUREWICK APPLIED AT THIS TIME. NO FURTHER CONCERNS OR COMPLAINTS.

## 2023-01-29 NOTE — ED APP Handoff Note (Signed)
[facility]  Emergency Department  Provider in Triage Note    Name: Judy Hicks  Age: 72 y.o.  Gender: female     Subjective:   Judy Hicks is a 72 y.o. female who presents with complaint of Chest Pain   .  Patient presents to ED today for chest pain.  Patient was recently discharged from [facility] for an non ST elevation myocardial infarction.  Heart catheterization was completed however patient blockages was at 40%.  Patient states since her discharge home Tuesday evening she has been having this chest pain.  Patient states chest pain is in the center of her chest and radiates up through her neck.    Objective:   There were no vitals filed for this visit.   Vitals are also documented in the EMR.  Focused Physical Exam shows no acute distress    Assessment:  A medical screening exam was completed.  This patient is a 72 y.o. female with Chest Pain   .    Plan:  Please see initial orders and work-up in the EMR.  This is to be continued with full evaluation in the main Emergency Department.     aspirin chewable tablet 324 mg, 324 mg, Oral, Now       Results for orders placed or performed during the hospital encounter of 01/29/23 (from the past 24 hour(s))   CBC/DIFF    Narrative    The following orders were created for panel order CBC/DIFF.  Procedure                               Abnormality         Status                     ---------                               -----------         ------                     CBC WITH BJYN[[id number]]                                                                 Please view results for these tests on the individual orders.          Bunnie Domino FNP-C   01/29/2023, 17:01   Department of Emergency Medicine  [facility]

## 2023-01-29 NOTE — H&P (Signed)
MEDICINE [facility]    HOSPITALIST H&P    Judy Hicks 72 y.o. female ED21/ED21   Date of Service: 01/29/2023    Date of Admission:  01/29/2023   PCP: Emilie Rutter, MD Code Status:Full Code       Chief Complaint:  " chest pain, shortness of breath, dizziness, nausea, abdominal discomfort "    HPI:   Judy Hicks is a 72 year old female who presented to the ER on 01/29/23 with complaints of midsternal chest pain radiating to left side of neck and right arm.  Patient also reported having associated dizziness, shortness of breath that's worse with exertion, severe headache, difficulty sleeping.  The patient stated that she was admitted at [facility] for for about 3 days and was discharged after multiple medications were discontinued from her usual home medications.  She stated that she was discharged a week ago.  The day after discharge, she woke up and noticed that she had mild chest pressure that was intermittent, progressively worsening.  She stated that yesterday, the pain was constant.  She reported that she also had difficulty getting her breath and shortness of breath was worse with exertion.  Patient reported that the headache was constant and severe.  Patient refused to go to ER until today, when she was too short of breath to talk per daughter.  She was given NTG sublingual x 1 in ER with resolution of chest pain.  Labs upon arrival to ER were as follows:  D-dimer 999, Trop 16/15/15, BNP 118.  CXR negative.  CT brain negative.  CTA chest negative for PE or acute infiltrate.  The patient was seen and examined for hospitalist admission while in ER.  Daughter at bedside.          ED medications:   Medications Administered in the ED   aspirin chewable tablet 324 mg (162 mg Oral Given 01/29/23 1712)   iohexol (OMNIPAQUE 350) infusion (100 mL Intravenous Given 01/29/23 2106)         PMHx:    Past Medical History:   Diagnosis Date    CAD (coronary artery disease) 09/24/2021    Depression  09/24/2021    Essential hypertension 09/24/2021    GERD (gastroesophageal reflux disease)     History of MI (myocardial infarction) 09/24/2021    HTN (hypertension)     Hyperlipidemia 09/24/2021    Lupus (CMS HCC) 09/24/2021    Lyme disease     Narcolepsy     Neuropathy (CMS HCC) 09/24/2021    Restless legs syndrome     Sleep apnea 09/24/2021    Thyroid disease 09/24/2021    Urinary incontinence 09/24/2021        PSHx:   Past Surgical History:   Procedure Laterality Date    CARDIAC CATHETERIZATION      CATARACT EXTRACTION, BILATERAL      HX APPENDECTOMY      HX BUNIONECTOMY Left     HX HIP REPLACEMENT Bilateral     HX HYSTERECTOMY            Allergies:    Allergies   Allergen Reactions    Azithromycin Itching, Rash, Shortness of Breath and Swelling     Other reaction(s): Other (see comments), Sweating (intolerance)    Gemfibrozil Itching, Rash, Shortness of Breath and Swelling     Other reaction(s): Other (see comments), Other (See Comments), Other - See Comments  Joint pain and fatigue   Joint pain and fatigue  Joint pain and fatigue   Joint pain and fatigue       Lisinopril Itching, Rash, Shortness of Breath and Swelling     Other reaction(s): Other (see comments)    Penicillins Shortness of Breath    Statins-Hmg-Coa Reductase Inhibitors Shortness of Breath    Levofloxacin Rash     Other reaction(s): Other (see comments)    Niacin Rash    Strawberry Itching and Rash    Ezetimibe Hives/ Urticaria     Other reaction(s): Other (see comments)    Social History  Social History     Tobacco Use    Smoking status: Never    Smokeless tobacco: Never   Vaping Use    Vaping status: Never Used   Substance Use Topics    Alcohol use: Never    Drug use: Never       Family History  Family Medical History:       Problem Relation (Age of Onset)    Colon Cancer Mother    Diabetes Other    Esophageal cancer Father    Hypertension (High Blood Pressure) Other    Liver Cancer Mother               Home Meds:      Prior to Admission  medications    Medication Sig Start Date End Date Taking? Authorizing Provider   escitalopram oxalate (LEXAPRO) 10 mg Oral Tablet Take 1 Tablet (10 mg total) by mouth Once a day    Provider, Historical   furosemide (LASIX) 20 mg Oral Tablet Take 1 Tablet (20 mg total) by mouth Once a day    Provider, Historical   gabapentin (NEURONTIN) 100 mg Oral Capsule Take 1 Capsule (100 mg total) by mouth    Provider, Historical   isosorbide mononitrate (IMDUR) 60 mg Oral Tablet Sustained Release 24 hr Take 1 Tablet (60 mg total) by mouth Every morning    Provider, Historical   levothyroxine (SYNTHROID) 25 mcg Oral Tablet Take 1 Tablet (25 mcg total) by mouth Every morning    Provider, Historical   losartan (COZAAR) 100 mg Oral Tablet Take 1 Tablet (100 mg total) by mouth Once a day    Provider, Historical   meclizine (ANTIVERT) 25 mg Oral Tablet Take 1 Tablet (25 mg total) by mouth Every 12 hours as needed 05/06/22   Judeth Cornfield, FNP-BC   metoprolol succinate (TOPROL-XL) 25 mg Oral Tablet Sustained Release 24 hr Take 1 Tablet (25 mg total) by mouth Once a day    Provider, Historical   mirabegron (MYRBETRIQ) 25 mg Oral Tablet Sustained Release 24 hr Take 1 Tablet (25 mg total) by mouth Once a day    Provider, Historical   omeprazole (PRILOSEC) 20 mg Oral Capsule, Delayed Release(E.C.) Take 1 Capsule (20 mg total) by mouth Once a day    Provider, Historical   ondansetron (ZOFRAN) 4 mg Oral Tablet Take 1 Tablet (4 mg total) by mouth Every 8 hours as needed for Nausea/Vomiting    Provider, Historical   rOPINIRole (REQUIP) 1 mg Oral Tablet Take 1 Tablet (1 mg total) by mouth Three times a day    Provider, Historical   traMADol-acetaminophen (ULTRACET) 37.5-325 mg Oral Tablet Take 1 Tablet by mouth Every 6 hours as needed 01/04/23   Judeth Cornfield, FNP-BC   traZODone (DESYREL) 50 mg Oral Tablet Take 1 Tablet (50 mg total) by mouth Every night    Provider, Historical  ROS:   Review of systems completed and was negative  except for what was mentioned in HPI.        Results for orders placed or performed during the hospital encounter of 01/29/23 (from the past 24 hour(s))   COMPREHENSIVE METABOLIC PANEL, NON-FASTING   Result Value Ref Range    SODIUM 140 136 - 145 mmol/L    POTASSIUM 3.9 3.5 - 5.1 mmol/L    CHLORIDE 106 98 - 107 mmol/L    CO2 TOTAL 26 21 - 31 mmol/L    ANION GAP 8 4 - 13 mmol/L    BUN 13 7 - 25 mg/dL    CREATININE 1.61 0.96 - 1.30 mg/dL    BUN/CREA RATIO 14 6 - 22    ESTIMATED GFR 66 >59 mL/min/1.18m^2    ALBUMIN 4.3 3.5 - 5.7 g/dL    CALCIUM 9.2 8.6 - 04.5 mg/dL    GLUCOSE 409 74 - 811 mg/dL    ALKALINE PHOSPHATASE 63 34 - 104 U/L    ALT (SGPT) 22 7 - 52 U/L    AST (SGOT) 34 13 - 39 U/L    BILIRUBIN TOTAL 0.6 0.3 - 1.0 mg/dL    PROTEIN TOTAL 7.0 6.4 - 8.9 g/dL    ALBUMIN/GLOBULIN RATIO 1.6 (H) 0.8 - 1.4    OSMOLALITY, CALCULATED 280 270 - 290 mOsm/kg    CALCIUM, CORRECTED 9.0 8.9 - 10.8 mg/dL    GLOBULIN 2.7 (L) 2.9 - 5.4   PT/INR   Result Value Ref Range    PROTHROMBIN TIME 11.8 9.8 - 12.7 seconds    INR 1.01 0.84 - 1.10   PTT (PARTIAL THROMBOPLASTIN TIME)   Result Value Ref Range    APTT 31.0 25.0 - 38.0 seconds   TROPONIN-I NOW   Result Value Ref Range    TROPONIN I 16 (H) <15 ng/L   CBC WITH DIFF   Result Value Ref Range    WBC 5.6 3.8 - 11.8 x10^3/uL    RBC 3.95 3.63 - 4.92 x10^6/uL    HGB 12.8 10.9 - 14.3 g/dL    HCT 91.4 78.2 - 95.6 %    MCV 95.3 75.5 - 95.3 fL    MCH 32.3 24.7 - 32.8 pg    MCHC 33.9 32.3 - 35.6 g/dL    RDW 21.3 08.6 - 57.8 %    PLATELETS 295 140 - 440 x10^3/uL    MPV 7.5 (L) 7.9 - 10.8 fL    NEUTROPHIL % 52 43 - 77 %    LYMPHOCYTE % 35 16 - 44 %    MONOCYTE % 8 5 - 13 %    EOSINOPHIL % 5 1 - 7 %    BASOPHIL % 1 0 - 1 %    NEUTROPHIL # 2.90 1.85 - 7.80 x10^3/uL    LYMPHOCYTE # 1.90 1.00 - 3.00 x10^3/uL    MONOCYTE # 0.40 0.30 - 1.00 x10^3/uL    EOSINOPHIL # 0.30 0.00 - 0.50 x10^3/uL    BASOPHIL # 0.00 0.00 - 0.10 x10^3/uL   D-DIMER   Result Value Ref Range    D-DIMER 999 (HH) 215 - 500 ng/mL  FEU   B-TYPE NATRIURETIC PEPTIDE (BNP),PLASMA   Result Value Ref Range    BNP 118 (H) 1 - 100 pg/mL   TROPONIN-I IN ONE HOUR   Result Value Ref Range    TROPONIN I 15 (H) <15 ng/L   TROPONIN-I IN THREE HOURS   Result Value Ref Range  TROPONIN I 15 (H) <15 ng/L          Physical:  Filed Vitals:    01/29/23 2113 01/29/23 2115 01/29/23 2145 01/29/23 2215   BP:  (!) 143/84 131/87 127/89   Pulse: 72 72 72 75   Resp: 14 12 20 12    Temp:       SpO2: 97% 98% 99% 98%      General: Patient is alert and oriented to person, place, and time. No acute distress. Communicates appropriately.   Throat: Moist oral mucosa. No erythema or exudate of the pharynx. Clear oropharynx.    Neck: Supple. No cervical lymphadenopathy or supraclavicular nodes detected. Trachea midline   Heart: Regular rate and rhythm. S1 & S2 present. No S3 or S4. No rubs, gallops, or murmurs appreciated.  Radial and dorsalis pedis pulses +2/4 bilaterally.  Brisk capillary refill.    Lungs: Clear to auscultation bilaterally with no wheezes or rales. Equal chest excursion.  No conversational dyspnea. No respiratory distress noted.   Abdomen: Soft, nondistended belly. Bowel sounds are present in all four quadrants. No rigidity.  No guarding.  No ascites. Mildly tender in central abdomen  Extremities: Bilateral lower extremity edema, right greater than left, patient states is baseline. Grossly moves all extremities.    Skin: Warm and dry without lesions. No ecchymosis noted.    Neurologic: Cranial nerves II through XII are grossly intact. Sensation to light touch is intact. Strength 5/5 in upper extremities and lower extremities bilaterally.    Genitourinary:  No urinary incontinence or Foley catheter   Psychiatric: Judgment and insight are intact. Mood and affect are appropriate for the situation.         Assessments:  Active Hospital Problems   (*Primary Problem)    Diagnosis    *Chest pain    Unstable angina (CMS HCC)    Hyperlipidemia     Chronic    Essential  hypertension     Chronic    CAD (coronary artery disease)     Chronic     Unstable angina        Chest pain   The patient was pain free after NTG SL given x 1 dose.  The patient had cath at [facility] a little over a week ago that showed 50% stenosis in RCA.  No stents were placed.  Multiple medications were discontinued from home medication list while at [facility].  Patient was discharged on aspirin, atorvastatin, Imdur, metoprolol.  These will be continued.  Tele-cardiology consulted for medication recommendations.  Patient will be monitored on telemetry.      2.  Hypertension   Patient had been on losartan, HCTZ, and Norvasc prior to discharge to [facility] and these were discontinued.  She states that her blood pressure at home has been 130-150/80-100's.  Blood pressure currently 127/89.  She will be continued on metoprolol as per home.      3.  Hyperlipidemia   Patient was discharged on atorvastatin.  This will be continued.            Plan:  Patient will be placed in observation for the above problems.  The patient will be monitored on telemetry.  Labs ordered for am.  Home medications will be restarted as appropriate once confirmed.  Further orders will depend upon clinical course.  The Hospitalist personally evaluated and examined the patient in conjunction with the MLP and agree with the assessments, treatment plan and disposition of the patient as recorded by the [facility].  Code status: Full Code  DVT prophylaxis: lovenox    Diet: DIET CARDIAC (2G NA, LOWFAT, LOW CHOL) Do you want to initiate MNT Protocol? Yes    Disposition:  The patient is currently acutely ill requiring treatment on the medical floor for observation. Patient will be closely evaluated monitor and remove be adjusted accordingly.  Estimated length of stay less than 48 hr to obtain full medical treatment.      Stanford Breed, FNP-BC    [place] MEDICINE HOSPITALIST      PHYSICIAN ADDENDUM  Patient was seen and examined as part of a shared visit with the  APP.  I personally spent more than 50% of the encounter with direct, face-to-face patient care including: obtaining history, performing physical exam, counseling the patient and family, and directing medical decision making.  My substantial findings are below:      PHYSICAL  Constitutional:  Alert and Oriented x4 in No acute distress   HEENT: normocephalic atraumatic extraocular movements intact  Cardiovascular: S1 S2 no obvious murmur  Respiratory: Clear to auscultation bilaterally,  no wheezes, rales, rhonchi  GI/GU: soft nontender nondistended bowel sounds present, normal pitch, normal active  Musculoskeletal: no clubbing cyanosis or edema  Neuro: alert orient x4 no focal deficits  Psych: cooperative appropriate mood affect    ASSESSMENT/PLAN:    Agree with plan outlined above    Tommye Standard, DO

## 2023-01-29 NOTE — ED Provider Notes (Signed)
[facility]  ED Primary Provider Note  Patient Name: Judy Hicks  Patient Age: 72 y.o.  Date of Birth: March 11, 1951    Chief Complaint: Chest Pain         History of Present Illness       Judy Hicks is a 72 y.o. female who had concerns including Chest Pain .  PATIENT PRESENTED TO THE EMERGENCY DEPARTMENT WITH COMPLAINTS OF CHEST PAIN, HEADACHE, HYPERTENSION.  PATIENT WAS SEEN AT [place] LAST WEEK AND WAS SENT TO [place] AFTER BEING DIAGNOSED WITH A HEART ATTACK.  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  PATIENT WAS DISCHARGED HOME AFTER THEY ADDED ASPIRIN 81 MG DAILY AND ATORVASTATIN 40 MG DAILY.  HOWEVER, PATIENT STATES THAT SHE WAS ALLERGIC TO ALL OF THE STATIN MEDICATIONS AND CAN NOT TAKE THEM.  THEY ALSO DISCHARGE HER ON CHOLECALCIFEROL, ISOSORBIDE MONONITRATE, METOPROLOL SUCCINATE, OMEPRAZOLE, ROPINIROLE AND TRAZODONE.  HOWEVER, THEY STOPPED HER MECLIZINE, MELATONIN, MIRABEGRON, MIRTAZAPINE, MONTELUKAST, NITROGLYCERIN, ZOFRAN, PREDNISONE, AMLODIPINE, ASCORBIC ACID, CETIRIZINE, CYANOCOBALAMIN, ESTRADIOL, EZETIMIBE, FERROUS SULFATE, FLUTICASONE, FUROSEMIDE, GABAPENTIN, HYDROXYZINE, IBERSARTAN-HYDROCHLOROTHIAZIDE AND HER LOSARTAN.  PATIENT STATES THAT HER BLOOD PRESSURE HAS BEEN OUT OF CONTROL SINCE THAT TIME.  SHE COMPLAINS OF A HEADACHE IN THE RIGHT SIDE OF HER HEAD, BUT DENIES ANY CHANGES IN VISION.  PATIENT STATES THAT SHE HAS BEEN EXPERIENCING CHEST PAIN FOR THE LAST 1 WEEK.  SHE STATES THAT SHE BECOMES VERY SHORT OF BREATH AND EXPERIENCES WORSENING PAIN WITH EXERTION.  SHE DENIES ANY RECENT FEVER OR CHILLS, COUGH, ABDOMINAL PAIN OR CHANGES IN BOWEL OR BLADDER HABITS.  SHE DENIES ANY SIGNIFICANT SWELLING OF THE LOWER EXTREMITIES BEYOND BASELINE.  NOTHING SEEMS TO MAKE HER SYMPTOMS BETTER.  PATIENT DENIES ANY FURTHER COMPLAINTS AT TIME OF EXAMINATION.        Review of Systems     No other overt Review of  Systems are noted to be positive except noted in the HPI.      Historical Data   History Reviewed This Encounter: Medical History  Surgical History  Family History  Social History      Physical Exam   ED Triage Vitals [01/29/23 1703]   BP (Non-Invasive) 134/89   Heart Rate 70   Respiratory Rate 18   Temperature 36.2 C (97.2 F)   SpO2 100 %   Weight 95.3 kg (210 lb)   Height 1.676 m (5\' 6" )         Nursing notes reviewed for what could be assessed. Past Medical, Surgical, and Social history reviewed for what has been completed.     Constitutional: NAD. Well-Developed. Well Nourished.  AFEBRILE  Head: Normocephalic, atraumatic.  Mouth/Throat: no nasal discharge, posterior pharynx WNL  Eyes: EOM grossly intact, conjunctiva normal.  Neck: Supple  Cardiovascular: Regular Rate and Rhythm, extremities well perfused.  Pulmonary/Chest: No respiratory distress. Lungs are symmetric to auscultation bilaterally.  Abdominal: Soft, non-tender, non-distended. Non peritoneal, no rebound, no guarding.  MSK:  NO SIGNIFICANT SWELLING OF THE LOWER EXTREMITIES BEYOND BASELINE, PER THE PATIENT  Skin: Warm, dry, and intact  Neuro: Appropriate, CN II-XII grossly intact.  NO MOTOR OR SENSORY DEFICITS NOTED ON EXAMINATION.  Psych: Cooperative           Procedures      Patient Data     Labs Ordered/Reviewed   COMPREHENSIVE METABOLIC PANEL, NON-FASTING - Abnormal; Notable for the following components:       Result  Value    ALBUMIN/GLOBULIN RATIO 1.6 (*)     GLOBULIN 2.7 (*)     All other components within normal limits    Narrative:     Estimated Glomerular Filtration Rate (eGFR) is calculated using the CKD-EPI (2021) equation, intended for patients 53 years of age and older. If gender is not documented or "unknown", there will be no eGFR calculation.     TROPONIN-I - Abnormal; Notable for the following components:    TROPONIN I 16 (*)     All other components within normal limits   TROPONIN-I - Abnormal; Notable for the following  components:    TROPONIN I 15 (*)     All other components within normal limits   TROPONIN-I - Abnormal; Notable for the following components:    TROPONIN I 15 (*)     All other components within normal limits   CBC WITH DIFF - Abnormal; Notable for the following components:    MPV 7.5 (*)     All other components within normal limits   B-TYPE NATRIURETIC PEPTIDE (BNP),PLASMA - Abnormal; Notable for the following components:    BNP 118 (*)     All other components within normal limits    Narrative:                                 Class 1: 101-250 pg/mL                              Class 2: 251-550 pg/mL                              Class 3: 551-900 pg/mL                              Class 4: >901 pg/mL     The [place] [employer] has developed a four-stage functional classification system for CHF that is based on a subjective interpretation of the severity of a patient's clinical signs and symptoms.    Class 1 - Patients have no limitations on physical activity and have no symptoms with ordinary physical activity.    Class 2 - Patients have a slight limitation of physical activity and have symptoms with ordinary physical activity.    Class 3 - Patients have a marked limitation of physical activity and have symptoms with less than ordinary physical activity, but not at rest.    Class 4 - Patients are unable to perform any physical activity without discomfort.   D-DIMER - Abnormal; Notable for the following components:    D-DIMER 999 (*)     All other components within normal limits    Narrative:     D-Dimers are reported in FEU per ng/mL.    IF PATIENT IS EXHIBITING SYMPTOMS DVT/PE, THIS D-DIMER RESULT MAY INDICATE A NEED FOR FURTHER TESTING FOR THESE CONDITIONS. IF PATIENT IS SUSPECTED OF DIC AND SYMPTOMS WORSEN OR PERSIST, A REPEAT DIC WORKUP SHOULD BE CONSIDERED.    NOTE: ALTHOUGH THE NORMAL RANGE FOR THIS TEST IS 215-500 ng/mL FEU, LITERATURE RECOMMENDS FURTHER TESTING FOR ANY RESULT >500ng/mL FEU.    A cut  off value of 500ng/mL FEU or below can be used as an aid in the diagnosis of Thromboembolism when used in conjunction  with the patient's medical history, clinical presentation and other findings. Results of 500ng/mL FEU or below have a negative predictive value of 100%.     PT/INR - Normal    Narrative:     Coumadin therapy INR range for Conventional Anticoagulation is 2.0 to 3.0 and for Intensive Anticoagulation 2.5 to 3.5.   PTT (PARTIAL THROMBOPLASTIN TIME) - Normal   CBC/DIFF    Narrative:     The following orders were created for panel order CBC/DIFF.  Procedure                               Abnormality         Status                     ---------                               -----------         ------                     CBC WITH MWNU[[id number]]                Abnormal            Final result                 Please view results for these tests on the individual orders.       CT BRAIN WO IV CONTRAST   Final Result by Edi, Radresults In (09/25 2113)   NO ACUTE FINDINGS         One or more dose reduction techniques were used (e.g., Automated exposure control, adjustment of the mA and/or kV according to patient size, use of iterative reconstruction technique).         Radiologist location ID: [id number]         CT ANGIO CHEST FOR PULMONARY EMBOLUS W IV CONTRAST   Final Result by Edi, Radresults In (09/25 2114)   Negative for pulmonary embolus or acute infiltrate.         One or more dose reduction techniques were used (e.g., Automated exposure control, adjustment of the mA and/or kV according to patient size, use of iterative reconstruction technique).         Radiologist location ID: [id number]         XR CHEST PA AND LATERAL   Final Result by Edi, Radresults In (09/25 1816)   NO ACUTE FINDINGS.         Radiologist location ID: [id number]             Medical Decision Making          Medical Decision Making        Studies Assessed:  LAB WORK, IMAGING, EKG    EKG:   This EKG interpreted by me shows:    Rate:  70    Interpretation:  NORMAL AXIS, SINUS RHYTHM, RATE 70, NONSPECIFIC ST-T WAVE CHANGES      MDM Narrative:  PATIENT PRESENTED TO THE EMERGENCY DEPARTMENT WITH COMPLAINTS OF CHEST PAIN, HEADACHE, HYPERTENSION.  PATIENT WAS SEEN AT [place] LAST WEEK AND WAS SENT TO [place] AFTER BEING DIAGNOSED WITH A HEART ATTACK.  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  PATIENT WAS DISCHARGED  HOME AFTER THEY ADDED ASPIRIN 81 MG DAILY AND ATORVASTATIN 40 MG DAILY.  HOWEVER, PATIENT STATES THAT SHE WAS ALLERGIC TO ALL OF THE STATIN MEDICATIONS AND CAN NOT TAKE THEM.  THEY ALSO DISCHARGE HER ON CHOLECALCIFEROL, ISOSORBIDE MONONITRATE, METOPROLOL SUCCINATE, OMEPRAZOLE, ROPINIROLE AND TRAZODONE.  HOWEVER, THEY STOPPED HER MECLIZINE, MELATONIN, MIRABEGRON, MIRTAZAPINE, MONTELUKAST, NITROGLYCERIN, ZOFRAN, PREDNISONE, AMLODIPINE, ASCORBIC ACID, CETIRIZINE, CYANOCOBALAMIN, ESTRADIOL, EZETIMIBE, FERROUS SULFATE, FLUTICASONE, FUROSEMIDE, GABAPENTIN, HYDROXYZINE, IBERSARTAN-HYDROCHLOROTHIAZIDE AND HER LOSARTAN.  PATIENT STATES THAT HER BLOOD PRESSURE HAS BEEN OUT OF CONTROL SINCE THAT TIME.  SHE COMPLAINS OF A HEADACHE IN THE RIGHT SIDE OF HER HEAD, BUT DENIES ANY CHANGES IN VISION.  PATIENT STATES THAT SHE HAS BEEN EXPERIENCING CHEST PAIN FOR THE LAST 1 WEEK.  SHE STATES THAT SHE BECOMES VERY SHORT OF BREATH AND EXPERIENCES WORSENING PAIN WITH EXERTION.  SHE DENIES ANY RECENT FEVER OR CHILLS, COUGH, ABDOMINAL PAIN OR CHANGES IN BOWEL OR BLADDER HABITS.  SHE DENIES ANY SIGNIFICANT SWELLING OF THE LOWER EXTREMITIES BEYOND BASELINE.  NOTHING SEEMS TO MAKE HER SYMPTOMS BETTER.  PATIENT DENIES ANY FURTHER COMPLAINTS AT TIME OF EXAMINATION.  PHYSICAL EXAMINATION REVEALS AN ELDERLY FEMALE IN NO APPARENT DISTRESS.  HEART AND LUNG EXAMINATION WITHIN NORMAL LIMITS.  THERE WAS NO SIGNIFICANT SWELLING OF THE LOWER EXTREMITIES NOTED ON EXAMINATION.  PERRLA.  NO NYSTAGMUS.  NO MOTOR  OR SENSORY DEFICITS NOTED ON EXAMINATION.  ASPIRIN WAS ORDERED IN TRIAGE.  LAB WORK AND IMAGING ORDERED IN TRIAGE.  PATIENT WAS STABLE.      ED Course as of 01/29/23 2224   Wed Jan 29, 2023   1723 WBC 5.6, HEMOGLOBIN 12.8, PLATELET COUNT 295   1740 PTT 31.0, PT 11.8, INR 1.01   1743 SODIUM 140, POTASSIUM 3.9, BUN 13, CREATININE 0.92, GLUCOSE 107, TOTAL BILIRUBIN 0.6, AST 34, ALT 22, TOTAL ALKALINE PHOSPHATASE 63   1749 TROPONIN 16.  REPEAT TROPONIN HAS BEEN ORDERED.   1806 BNP 118   1816 CHEST X-RAY REVEALED:   FINDINGS:     The heart size is normal.  There are atherosclerotic calcifications of the thoracic aorta.  There may be some air trapping. There are no acute infiltrates   Degenerative changes are identified within the thoracic spine.    There is some right dorsal scoliosis     IMPRESSION:  NO ACUTE FINDINGS.   1913 REPEAT TROPONIN IN 1 HOUR WAS 15   1931 D-DIMER 999.  CTA OF THE CHEST WAS ORDERED.   2055 REPEAT TROPONIN IN 3 HOURS WAS ALSO 15   2116 CT IMAGING OF THE BRAIN REVEALED:   FINDINGS:  There is no acute intracranial hemorrhage, mass effect, or evidence of large acute infarct.     Brain: Low density in the periventricular white matter suggests mild chronic small vessel ischemic changes.     CSF Spaces: Mild generalized cerebral atrophy      Sinuses/Mastoids:  Unremarkable.      Bones: Unremarkable        IMPRESSION:  NO ACUTE FINDINGS   2116 CTA OF THE CHEST REVEALED:   FINDINGS:  Hardware:  None.     Lymph nodes:   No mediastinal, hilar, or axillary lymphadenopathy.     Heart:  Coronary artery calcifications are noted.        RV/LV Diameter Ratio: N/A     Thoracic Aorta:  No thoracic aortic aneurysm or dissection.     Pulmonary Vessels:  No evidence of acute pulmonary emboli through the major subsegmental branches.  Most Proximal Level Of Embolus (if embolus present): N/A     Lungs and Airways:  The lungs are normally expanded and clear.     Pleura: No pleural effusion.  No pneumothorax.      Upper Abdomen: There is a sliding hiatal hernia     Bones: Degenerative changes of the thoracic spine.        IMPRESSION:  Negative for pulmonary embolus or acute infiltrate.   2121 SECURE CHAT SENT TO HOSPITALIST TO EVALUATE FOR ADMISSION   2130 NITROGLYCERIN WAS ORDERED   2146 ON REEXAMINATION, PATIENT WAS RESTING COMFORTABLY AND IN NO APPARENT DISTRESS.  PATIENT AND DAUGHTER WERE COUNSELED AND EDUCATED ON LABORATORY AND IMAGING FINDINGS.  ALL QUESTIONS WERE ANSWERED TO SATISFACTION.  PATIENT WAS STABLE.   2223 CASE DISCUSSED WITH HOSPITALIST SERVICE, WHO WAS AGREEABLE TO ADMISSION.  ALL QUESTIONS WERE ANSWERED TO SATISFACTION.  PATIENT STABLE AT TIME OF ADMISSION.         Medications Administered in the ED   aspirin chewable tablet 324 mg (162 mg Oral Given 01/29/23 1712)   iohexol (OMNIPAQUE 350) infusion (100 mL Intravenous Given 01/29/23 2106)       Patient will be admitted to the  service for further workup and management.    Disposition: Admitted             Clinical Impression   Chest pain, unspecified type (Primary)   Dyspnea on exertion   Headache   Hypertension, unspecified type   Coronary artery disease, unspecified vessel or lesion type, unspecified whether angina present, unspecified whether native or transplanted heart         Current Discharge Medication List            Tawanna Sat, DO

## 2023-01-29 NOTE — ED Nurses Note (Signed)
PATIENT REPORTS THAT CHEST PAIN IS GONE AFTER NITRO.

## 2023-01-29 NOTE — ED Nurses Note (Signed)
NURSE IN TO SEE PATIENT. PATIENT HAS NO CONCERNS OR COMPLAINTS, PATIENT PLACED IN GOWN AT THIS TIME.

## 2023-01-29 NOTE — ED Triage Notes (Signed)
Patient was sent to  from [place] and patient reports having a heart attack. Patient had a heart cath with 40% blockage and they left it alone. Patient c/o chest pain since Tuesday, patient had been refusing to come to the ER. Patients daughter brought her because she couldn't talk when they were on the phone earlier

## 2023-01-30 DIAGNOSIS — E785 Hyperlipidemia, unspecified: Secondary | ICD-10-CM

## 2023-01-30 DIAGNOSIS — G4733 Obstructive sleep apnea (adult) (pediatric): Secondary | ICD-10-CM

## 2023-01-30 DIAGNOSIS — I214 Non-ST elevation (NSTEMI) myocardial infarction: Secondary | ICD-10-CM

## 2023-01-30 DIAGNOSIS — I252 Old myocardial infarction: Secondary | ICD-10-CM

## 2023-01-30 LAB — CBC WITH DIFF
BASOPHIL #: 0.1 10*3/uL (ref 0.00–0.10)
BASOPHIL %: 1 % (ref 0–1)
EOSINOPHIL #: 0.3 10*3/uL (ref 0.00–0.50)
EOSINOPHIL %: 6 % (ref 1–7)
HCT: 34.8 % (ref 31.2–41.9)
HGB: 11.9 g/dL (ref 10.9–14.3)
LYMPHOCYTE #: 1.9 10*3/uL (ref 1.00–3.00)
LYMPHOCYTE %: 34 % (ref 16–44)
MCH: 32.7 pg (ref 24.7–32.8)
MCHC: 34.3 g/dL (ref 32.3–35.6)
MCV: 95.2 fL (ref 75.5–95.3)
MONOCYTE #: 0.6 10*3/uL (ref 0.30–1.00)
MONOCYTE %: 10 % (ref 5–13)
MPV: 7.8 fL — ABNORMAL LOW (ref 7.9–10.8)
NEUTROPHIL #: 2.8 10*3/uL (ref 1.85–7.80)
NEUTROPHIL %: 50 % (ref 43–77)
PLATELETS: 250 10*3/uL (ref 140–440)
RBC: 3.65 10*6/uL (ref 3.63–4.92)
RDW: 14.1 % (ref 12.3–17.7)
WBC: 5.7 10*3/uL (ref 3.8–11.8)

## 2023-01-30 LAB — BASIC METABOLIC PANEL
ANION GAP: 9 mmol/L (ref 4–13)
BUN/CREA RATIO: 16 (ref 6–22)
BUN: 14 mg/dL (ref 7–25)
CALCIUM: 8.7 mg/dL (ref 8.6–10.3)
CHLORIDE: 108 mmol/L — ABNORMAL HIGH (ref 98–107)
CO2 TOTAL: 23 mmol/L (ref 21–31)
CREATININE: 0.88 mg/dL (ref 0.60–1.30)
ESTIMATED GFR: 70 mL/min/{1.73_m2} (ref >59)
GLUCOSE: 110 mg/dL — ABNORMAL HIGH (ref 74–109)
OSMOLALITY, CALCULATED: 281 mosm/kg (ref 270–290)
POTASSIUM: 3.4 mmol/L — ABNORMAL LOW (ref 3.5–5.1)
SODIUM: 140 mmol/L (ref 136–145)

## 2023-01-30 LAB — MAGNESIUM: MAGNESIUM: 2 mg/dL (ref 1.9–2.7)

## 2023-01-30 MED ORDER — HYDRALAZINE 20 MG/ML INJECTION SOLUTION
10.0000 mg | INTRAMUSCULAR | Status: AC
Start: 2023-01-31 — End: 2023-01-30
  Administered 2023-01-30: 10 mg via INTRAVENOUS
  Filled 2023-01-30: qty 1

## 2023-01-30 MED ORDER — TRAZODONE 50 MG TABLET
50.0000 mg | ORAL_TABLET | Freq: Every evening | ORAL | Status: DC
Start: 2023-01-30 — End: 2023-02-01
  Administered 2023-01-30 – 2023-01-31 (×2): 50 mg via ORAL
  Filled 2023-01-30 (×2): qty 1

## 2023-01-30 MED ORDER — PANTOPRAZOLE 40 MG TABLET,DELAYED RELEASE
40.0000 mg | DELAYED_RELEASE_TABLET | Freq: Every day | ORAL | Status: DC
Start: 2023-01-30 — End: 2023-02-01
  Administered 2023-01-30: 40 mg via ORAL
  Administered 2023-01-31: 0 mg via ORAL
  Administered 2023-01-31: 40 mg via ORAL
  Filled 2023-01-30 (×3): qty 1

## 2023-01-30 MED ORDER — POTASSIUM CHLORIDE ER 20 MEQ TABLET,EXTENDED RELEASE(PART/CRYST)
40.0000 meq | ORAL_TABLET | Freq: Once | ORAL | Status: AC
Start: 2023-01-30 — End: 2023-01-30
  Administered 2023-01-30: 40 meq via ORAL
  Filled 2023-01-30: qty 2

## 2023-01-30 MED ORDER — NITROGLYCERIN 0.4 MG SUBLINGUAL TABLET
0.4000 mg | SUBLINGUAL_TABLET | SUBLINGUAL | Status: DC | PRN
Start: 2023-01-30 — End: 2023-02-01
  Administered 2023-01-30 (×2): 0.4 mg via SUBLINGUAL
  Filled 2023-01-30: qty 1

## 2023-01-30 MED ORDER — GABAPENTIN 100 MG CAPSULE
100.0000 mg | ORAL_CAPSULE | Freq: Four times a day (QID) | ORAL | Status: DC | PRN
Start: 2023-01-30 — End: 2023-02-01
  Administered 2023-01-30 – 2023-01-31 (×3): 100 mg via ORAL
  Filled 2023-01-30 (×3): qty 1

## 2023-01-30 MED ORDER — ROPINIROLE 1 MG TABLET
2.0000 mg | ORAL_TABLET | Freq: Two times a day (BID) | ORAL | Status: DC
Start: 2023-01-30 — End: 2023-02-01
  Administered 2023-01-30 – 2023-01-31 (×3): 2 mg via ORAL
  Filled 2023-01-30 (×4): qty 2

## 2023-01-30 NOTE — Nurses Notes (Signed)
Episode of increasing chest pain. Patient rates 7/10. Does not radiate. Feels like pressure on chest. Provider notified. PRN nitro given.

## 2023-01-30 NOTE — ED Nurses Note (Signed)
REPORT CALLED TO WENDY ON 3W.

## 2023-01-30 NOTE — Progress Notes (Signed)
01/30/2023  16:24  Judy Hicks  [id number]    History: 72 yr old female with PMH of HTN, GERD, Lupus, Lyme's Disease, RLS, CAD, hypothyroidism, who presented to the hospital with chest pain and significant shortness of breath. Patient had recent heart cath at [facility] and was supposedly found to have 40-50% blockage but waiting on official reports to confirm.    Today: States has had significant anxiety recently with loss of her grandchild who was murdered and other life events that have added stress.     Past Medical History  Cholecalciferol (Vitamin D3), aspirin, atorvastatin, escitalopram oxalate, furosemide, gabapentin, isosorbide mononitrate, levothyroxine, losartan, meclizine, metoprolol succinate, mirabegron, omeprazole, ondansetron, rOPINIRole, traMADol-acetaminophen, and traZODone   Allergies   Allergen Reactions    Azithromycin Itching, Rash, Shortness of Breath and Swelling     Other reaction(s): Other (see comments), Sweating (intolerance)    Gemfibrozil Itching, Rash, Shortness of Breath and Swelling     Other reaction(s): Other (see comments), Other (See Comments), Other - See Comments  Joint pain and fatigue   Joint pain and fatigue   Joint pain and fatigue   Joint pain and fatigue       Lisinopril Itching, Rash, Shortness of Breath and Swelling     Other reaction(s): Other (see comments)    Penicillins Shortness of Breath    Statins-Hmg-Coa Reductase Inhibitors Shortness of Breath    Levofloxacin Rash     Other reaction(s): Other (see comments)    Niacin Rash    Strawberry Itching and Rash    Ezetimibe Hives/ Urticaria     Other reaction(s): Other (see comments)     Past Medical History:   Diagnosis Date    CAD (coronary artery disease) 09/24/2021    Depression 09/24/2021    Essential hypertension 09/24/2021    GERD (gastroesophageal reflux disease)     History of MI (myocardial infarction) 09/24/2021    HTN (hypertension)     Hyperlipidemia 09/24/2021    Lupus (CMS HCC) 09/24/2021    Lyme disease      Narcolepsy     Neuropathy (CMS HCC) 09/24/2021    Restless legs syndrome     Sleep apnea 09/24/2021    Thyroid disease 09/24/2021    Urinary incontinence 09/24/2021         Past Surgical History:   Procedure Laterality Date    CARDIAC CATHETERIZATION      CATARACT EXTRACTION, BILATERAL      HX APPENDECTOMY      HX BUNIONECTOMY Left     HX HIP REPLACEMENT Bilateral     HX HYSTERECTOMY           Family Medical History:       Problem Relation (Age of Onset)    Colon Cancer Mother    Diabetes Other    Esophageal cancer Father    Hypertension (High Blood Pressure) Other    Liver Cancer Mother            Social History     Socioeconomic History    Marital status: Married   Tobacco Use    Smoking status: Never    Smokeless tobacco: Never   Vaping Use    Vaping status: Never Used   Substance and Sexual Activity    Alcohol use: Never    Drug use: Never     Social Determinants of Health     Social Connections: Low Risk  (01/30/2023)    Social Connections     SDOH Social  Isolation: 5 or more times a week       Physical exam:  BP 139/82   Pulse 74   Temp 36.7 C (98.1 F)   Resp 18   Ht 1.676 m (5' 5.98")   Wt 99.4 kg (219 lb 1.6 oz)   SpO2 95%   BMI 35.38 kg/m       Gen: This is a 72 y.o. female who is awake alert and oriented x3 in no acute distress  CV:  Regular rate and rhythm without murmurs rubs or gallops.  2+ pedal and radial pulses. No clubbing, cyanosis, or edema.  RESP:  Clear to auscultation bilaterally without wheezes rales or rhonchi.  Respirations are nonlabored.  GI:  Abdomen is soft, nontender, nondistended.  Bowel sounds normoactive.  GU: No foley is present  MSK:  Full range of motion of upper and lower extremities  NEURO:  Cranial nerves 2-12 grossly intact.  No focal deficits as tested.  SKIN: Warm, dry, intact, without lesions, rashes, or ulcerations    Diagnostic Labs:  Results for orders placed or performed during the hospital encounter of 01/29/23 (from the past 24 hour(s))   CBC/DIFF     Narrative    The following orders were created for panel order CBC/DIFF.  Procedure                               Abnormality         Status                     ---------                               -----------         ------                     CBC WITH YNWG[[id number]]                Abnormal            Final result                 Please view results for these tests on the individual orders.   COMPREHENSIVE METABOLIC PANEL, NON-FASTING   Result Value Ref Range    SODIUM 140 136 - 145 mmol/L    POTASSIUM 3.9 3.5 - 5.1 mmol/L    CHLORIDE 106 98 - 107 mmol/L    CO2 TOTAL 26 21 - 31 mmol/L    ANION GAP 8 4 - 13 mmol/L    BUN 13 7 - 25 mg/dL    CREATININE 5.78 4.69 - 1.30 mg/dL    BUN/CREA RATIO 14 6 - 22    ESTIMATED GFR 66 >59 mL/min/1.41m^2    ALBUMIN 4.3 3.5 - 5.7 g/dL    CALCIUM 9.2 8.6 - 62.9 mg/dL    GLUCOSE 528 74 - 413 mg/dL    ALKALINE PHOSPHATASE 63 34 - 104 U/L    ALT (SGPT) 22 7 - 52 U/L    AST (SGOT) 34 13 - 39 U/L    BILIRUBIN TOTAL 0.6 0.3 - 1.0 mg/dL    PROTEIN TOTAL 7.0 6.4 - 8.9 g/dL    ALBUMIN/GLOBULIN RATIO 1.6 (H) 0.8 - 1.4    OSMOLALITY, CALCULATED 280 270 - 290 mOsm/kg    CALCIUM, CORRECTED 9.0 8.9 - 10.8 mg/dL  GLOBULIN 2.7 (L) 2.9 - 5.4    Narrative    Estimated Glomerular Filtration Rate (eGFR) is calculated using the CKD-EPI (2021) equation, intended for patients 61 years of age and older. If gender is not documented or "unknown", there will be no eGFR calculation.     PT/INR   Result Value Ref Range    PROTHROMBIN TIME 11.8 9.8 - 12.7 seconds    INR 1.01 0.84 - 1.10    Narrative    Coumadin therapy INR range for Conventional Anticoagulation is 2.0 to 3.0 and for Intensive Anticoagulation 2.5 to 3.5.   PTT (PARTIAL THROMBOPLASTIN TIME)   Result Value Ref Range    APTT 31.0 25.0 - 38.0 seconds   TROPONIN-I NOW   Result Value Ref Range    TROPONIN I 16 (H) <15 ng/L   TROPONIN-I IN ONE HOUR   Result Value Ref Range    TROPONIN I 15 (H) <15 ng/L   TROPONIN-I IN THREE HOURS   Result Value Ref  Range    TROPONIN I 15 (H) <15 ng/L   CBC WITH DIFF   Result Value Ref Range    WBC 5.6 3.8 - 11.8 x10^3/uL    RBC 3.95 3.63 - 4.92 x10^6/uL    HGB 12.8 10.9 - 14.3 g/dL    HCT 95.6 21.3 - 08.6 %    MCV 95.3 75.5 - 95.3 fL    MCH 32.3 24.7 - 32.8 pg    MCHC 33.9 32.3 - 35.6 g/dL    RDW 57.8 46.9 - 62.9 %    PLATELETS 295 140 - 440 x10^3/uL    MPV 7.5 (L) 7.9 - 10.8 fL    NEUTROPHIL % 52 43 - 77 %    LYMPHOCYTE % 35 16 - 44 %    MONOCYTE % 8 5 - 13 %    EOSINOPHIL % 5 1 - 7 %    BASOPHIL % 1 0 - 1 %    NEUTROPHIL # 2.90 1.85 - 7.80 x10^3/uL    LYMPHOCYTE # 1.90 1.00 - 3.00 x10^3/uL    MONOCYTE # 0.40 0.30 - 1.00 x10^3/uL    EOSINOPHIL # 0.30 0.00 - 0.50 x10^3/uL    BASOPHIL # 0.00 0.00 - 0.10 x10^3/uL   B-TYPE NATRIURETIC PEPTIDE (BNP),PLASMA   Result Value Ref Range    BNP 118 (H) 1 - 100 pg/mL    Narrative                                Class 1: 101-250 pg/mL                              Class 2: 251-550 pg/mL                              Class 3: 551-900 pg/mL                              Class 4: >901 pg/mL     The [place] [employer] has developed a four-stage functional classification system for CHF that is based on a subjective interpretation of the severity of a patient's clinical signs and symptoms.    Class 1 - Patients have no limitations on physical activity and have no symptoms with ordinary  physical activity.    Class 2 - Patients have a slight limitation of physical activity and have symptoms with ordinary physical activity.    Class 3 - Patients have a marked limitation of physical activity and have symptoms with less than ordinary physical activity, but not at rest.    Class 4 - Patients are unable to perform any physical activity without discomfort.   D-DIMER   Result Value Ref Range    D-DIMER 999 (HH) 215 - 500 ng/mL FEU    Narrative    D-Dimers are reported in FEU per ng/mL.    IF PATIENT IS EXHIBITING SYMPTOMS DVT/PE, THIS D-DIMER RESULT MAY INDICATE A NEED FOR FURTHER TESTING FOR THESE  CONDITIONS. IF PATIENT IS SUSPECTED OF DIC AND SYMPTOMS WORSEN OR PERSIST, A REPEAT DIC WORKUP SHOULD BE CONSIDERED.    NOTE: ALTHOUGH THE NORMAL RANGE FOR THIS TEST IS 215-500 ng/mL FEU, LITERATURE RECOMMENDS FURTHER TESTING FOR ANY RESULT >500ng/mL FEU.    A cut off value of 500ng/mL FEU or below can be used as an aid in the diagnosis of Thromboembolism when used in conjunction with the patient's medical history, clinical presentation and other findings. Results of 500ng/mL FEU or below have a negative predictive value of 100%.     CBC/DIFF    Narrative    The following orders were created for panel order CBC/DIFF.  Procedure                               Abnormality         Status                     ---------                               -----------         ------                     CBC WITH DIFF[[id number]]                Abnormal            Final result                 Please view results for these tests on the individual orders.   BASIC METABOLIC PANEL, NON-FASTING   Result Value Ref Range    SODIUM 140 136 - 145 mmol/L    POTASSIUM 3.4 (L) 3.5 - 5.1 mmol/L    CHLORIDE 108 (H) 98 - 107 mmol/L    CO2 TOTAL 23 21 - 31 mmol/L    ANION GAP 9 4 - 13 mmol/L    CALCIUM 8.7 8.6 - 10.3 mg/dL    GLUCOSE 841 (H) 74 - 109 mg/dL    BUN 14 7 - 25 mg/dL    CREATININE 3.24 4.01 - 1.30 mg/dL    BUN/CREA RATIO 16 6 - 22    ESTIMATED GFR 70 >59 mL/min/1.42m^2    OSMOLALITY, CALCULATED 281 270 - 290 mOsm/kg    Narrative    Estimated Glomerular Filtration Rate (eGFR) is calculated using the CKD-EPI (2021) equation, intended for patients 1 years of age and older. If gender is not documented or "unknown", there will be no eGFR calculation.     MAGNESIUM   Result Value Ref Range    MAGNESIUM 2.0 1.9 -  2.7 mg/dL   CBC WITH DIFF   Result Value Ref Range    WBC 5.7 3.8 - 11.8 x10^3/uL    RBC 3.65 3.63 - 4.92 x10^6/uL    HGB 11.9 10.9 - 14.3 g/dL    HCT 34.7 42.5 - 95.6 %    MCV 95.2 75.5 - 95.3 fL    MCH 32.7 24.7 - 32.8 pg    MCHC  34.3 32.3 - 35.6 g/dL    RDW 38.7 56.4 - 33.2 %    PLATELETS 250 140 - 440 x10^3/uL    MPV 7.8 (L) 7.9 - 10.8 fL    NEUTROPHIL % 50 43 - 77 %    LYMPHOCYTE % 34 16 - 44 %    MONOCYTE % 10 5 - 13 %    EOSINOPHIL % 6 1 - 7 %    BASOPHIL % 1 0 - 1 %    NEUTROPHIL # 2.80 1.85 - 7.80 x10^3/uL    LYMPHOCYTE # 1.90 1.00 - 3.00 x10^3/uL    MONOCYTE # 0.60 0.30 - 1.00 x10^3/uL    EOSINOPHIL # 0.30 0.00 - 0.50 x10^3/uL    BASOPHIL # 0.10 0.00 - 0.10 x10^3/uL       Diagnostic Imaging:  CT BRAIN WO IV CONTRAST   Final Result   NO ACUTE FINDINGS         One or more dose reduction techniques were used (e.g., Automated exposure control, adjustment of the mA and/or kV according to patient size, use of iterative reconstruction technique).         Radiologist location ID: [id number]         CT ANGIO CHEST FOR PULMONARY EMBOLUS W IV CONTRAST   Final Result   Negative for pulmonary embolus or acute infiltrate.         One or more dose reduction techniques were used (e.g., Automated exposure control, adjustment of the mA and/or kV according to patient size, use of iterative reconstruction technique).         Radiologist location ID: [id number]         XR CHEST PA AND LATERAL   Final Result   NO ACUTE FINDINGS.         Radiologist location ID: [id number]              Assesment/Plan:  Patient Active Problem List   Diagnosis    Urinary incontinence    CAD (coronary artery disease)    Depression    Essential hypertension    GERD (gastroesophageal reflux disease)    History of MI (myocardial infarction)    Hyperlipidemia    Lupus (CMS HCC)    Neuropathy (CMS HCC)    Sleep apnea    Thyroid disease    Chest pain    Unstable angina (CMS HCC)        Chest Pain  Relieved by nitro  Already on Imdur  Unclear if cardiac or other etiology  PE and other resp disease ruled out as cause of shortness of breath  Waiting on [facility]    DVT Prophylaxis: Lovenox  Diet: Cardiac  Physical Therapy: Consulted  Disposition: Home    On the day of the  encounter, a total of  45 minutes was spent on this patient encounter including review of historical information, examination, documentation and post-visit activities. The time documented excludes procedural time.    Ebbie Ridge, DO

## 2023-01-30 NOTE — Nurses Notes (Signed)
Patient states intermittent chest pain rated 4-5. Nitro tablets taken at home. Contacted provider. Nitro 0.4 mg SL ordered PRN.

## 2023-01-30 NOTE — Care Plan (Signed)
Problem: Chest Pain  Goal: Resolution of Chest Pain Symptoms  01/30/2023 0527 by Cleta Alberts, RN  Outcome: Ongoing (see interventions/notes)  01/30/2023 0527 by Cleta Alberts, RN  Outcome: Ongoing (see interventions/notes)     Problem: Fall Injury Risk  Goal: Absence of Fall and Fall-Related Injury  01/30/2023 0527 by Cleta Alberts, RN  Outcome: Ongoing (see interventions/notes)  01/30/2023 0527 by Cleta Alberts, RN  Outcome: Ongoing (see interventions/notes)     Problem: Health Knowledge, Opportunity to Enhance (Adult,Obstetrics,Pediatric)  Goal: Knowledgeable about Health Subject/Topic  Description: Patient will demonstrate the desired outcomes by discharge/transition of care.  01/30/2023 0527 by Cleta Alberts, RN  Outcome: Ongoing (see interventions/notes)  01/30/2023 0527 by Cleta Alberts, RN  Outcome: Ongoing (see interventions/notes)   Chest pain non radiating rated 3/10 relieved with nitro x1. Dyspnea noted on exertion with edema to BLE w/ 2+ pedal pulses. No s/s of distress noted at this time.

## 2023-01-30 NOTE — Telemedicine Consult (Signed)
[facility]  Cardiology   Consult      TELEMEDICINE CARDIOLOGY CONSULT NOTE    Telemedicine Documentation:   Modality: Video  Provider Physical Location: [place], [facility]  Patient/family aware of provider location: Yes  Patient/family consent for telemedicine: Yes  Examination observed and performed by: Hassel Neth, PA-C, Dr. Despina Hidden  Patient Location: [facility]    Name:  Judy Hicks   DOB: 25-Sep-1950   MRN: [id number]   Date:  01/29/2023     PCP: Emilie Rutter, MD   Hospital Day:  LOS: 0 days   Chief Complaint: Chest Pain       IMPRESSION/PLAN:  Noncardiac chest pain  Recent NSTEMI  HTN  HLD    Obtain the cath and echo reports from [facility]. Recent cath reportedly showed nonobstructive CAD with a blockage of around 40-50%. No intervention taken at that time. Overall symptomatology does not correlate with ACS given stable troponins. Would recommend exploring other explanations for patient's symptoms. Patient's headahces could be attributed to the Imdur.     Echo: Obtain echo report from [facility]    HPI:  Judy Hicks is a 72 y.o. female who presented to the [facility] ER on 01/29/23 with complaints of midsternal chest pain radiating to left side of neck and right arm. Patient also reported having associated dizziness, shortness of breath that's worse with exertion, severe headache, difficulty sleeping. The patient stated that she was admitted at [facility] on 9-16 for about 3 days after having an NSTEMI. She was discharged approximately 1 week ago after several medications were stopped and new ones were started. Of note, patient was started on Imdur during that admission. The day after discharge, she woke up and noticed that she had mild chest pressure that was intermittent, progressively worsening. She stated that the day prior to presenting to the ER, the pain was constant. She reported that she also had difficulty getting her breath and shortness of breath was worse with exertion.  Patient reported that the headache was constant and severe. Patient refused to go to ER until today, when she was too short of breath to talk per daughter. She was given NTG sublingual x 1 in ER with resolution of chest pain. Labs upon arrival to ER were as follows: D-dimer 999, Trop 16/15/15, BNP 118. CXR negative. CT brain negative. CTA chest negative for PE or acute infiltrate.       Past Medical History:   Diagnosis Date    CAD (coronary artery disease) 09/24/2021    Depression 09/24/2021    Essential hypertension 09/24/2021    GERD (gastroesophageal reflux disease)     History of MI (myocardial infarction) 09/24/2021    HTN (hypertension)     Hyperlipidemia 09/24/2021    Lupus (CMS HCC) 09/24/2021    Lyme disease     Narcolepsy     Neuropathy (CMS HCC) 09/24/2021    Restless legs syndrome     Sleep apnea 09/24/2021    Thyroid disease 09/24/2021    Urinary incontinence 09/24/2021         Patient Active Problem List    Diagnosis Date Noted    Chest pain 01/29/2023    Unstable angina (CMS HCC) 01/29/2023    Urinary incontinence 09/24/2021    CAD (coronary artery disease) 09/24/2021    Depression 09/24/2021    Essential hypertension 09/24/2021    GERD (gastroesophageal reflux disease) 09/24/2021    History of MI (myocardial infarction) 09/24/2021  Hyperlipidemia 09/24/2021    Lupus (CMS HCC) 09/24/2021    Neuropathy (CMS HCC) 09/24/2021    Sleep apnea 09/24/2021    Thyroid disease 09/24/2021      Past Surgical History:   Procedure Laterality Date    CARDIAC CATHETERIZATION      CATARACT EXTRACTION, BILATERAL      HX APPENDECTOMY      HX BUNIONECTOMY Left     HX HIP REPLACEMENT Bilateral     HX HYSTERECTOMY            acetaminophen (TYLENOL) tablet, 650 mg, Oral, Q4H PRN  aluminum-magnesium hydroxide-simethicone (MAG-AL PLUS) 200-200-20 mg per 5 mL oral liquid, 30 mL, Oral, Q4H PRN  aspirin chewable tablet 81 mg, 81 mg, Oral, Daily  atorvastatin (LIPITOR) tablet, 40 mg, Oral, QPM  enoxaparin PF (LOVENOX) 40 mg/0.4  mL SubQ injection, 40 mg, Subcutaneous, Q24H  isosorbide mononitrate (IMDUR) 24 hr extended release tablet, 60 mg, Oral, Daily  meclizine (ANTIVERT) tablet, 12.5 mg, Oral, Q8H PRN  metoprolol succinate (TOPROL-XL) 24 hr extended release tablet, 25 mg, Oral, Daily  NS flush syringe, 3 mL, Intracatheter, Q8HRS  NS flush syringe, 3 mL, Intracatheter, Q1H PRN  NS premix infusion, , Intravenous, Continuous  ondansetron (ZOFRAN) 2 mg/mL injection, 4 mg, Intravenous, Q6H PRN  traZODone (DESYREL) tablet, 50 mg, Oral, NIGHTLY         Allergies   Allergen Reactions    Azithromycin Itching, Rash, Shortness of Breath and Swelling     Other reaction(s): Other (see comments), Sweating (intolerance)    Gemfibrozil Itching, Rash, Shortness of Breath and Swelling     Other reaction(s): Other (see comments), Other (See Comments), Other - See Comments  Joint pain and fatigue   Joint pain and fatigue   Joint pain and fatigue   Joint pain and fatigue       Lisinopril Itching, Rash, Shortness of Breath and Swelling     Other reaction(s): Other (see comments)    Penicillins Shortness of Breath    Statins-Hmg-Coa Reductase Inhibitors Shortness of Breath    Levofloxacin Rash     Other reaction(s): Other (see comments)    Niacin Rash    Strawberry Itching and Rash    Ezetimibe Hives/ Urticaria     Other reaction(s): Other (see comments)     Family Medical History:       Problem Relation (Age of Onset)    Colon Cancer Mother    Diabetes Other    Esophageal cancer Father    Hypertension (High Blood Pressure) Other    Liver Cancer Mother            Social History     Tobacco Use    Smoking status: Never    Smokeless tobacco: Never   Substance Use Topics    Alcohol use: Never       ROS: All other ROS are negative except for what is noted in HPI     I/O last 24 hours:    Intake/Output Summary (Last 24 hours) at 01/30/2023 1034  Last data filed at 01/30/2023 0550  Gross per 24 hour   Intake 503.75 ml   Output --   Net 503.75 ml     I/O current  shift:  No intake/output data recorded.    DIAGNOSTIC STUDIES:    Results for orders placed or performed during the hospital encounter of 01/29/23 (from the past 24 hour(s))   CBC/DIFF    Narrative    The following orders  were created for panel order CBC/DIFF.  Procedure                               Abnormality         Status                     ---------                               -----------         ------                     CBC WITH ATFT[[id number]]                Abnormal            Final result                 Please view results for these tests on the individual orders.   COMPREHENSIVE METABOLIC PANEL, NON-FASTING   Result Value Ref Range    SODIUM 140 136 - 145 mmol/L    POTASSIUM 3.9 3.5 - 5.1 mmol/L    CHLORIDE 106 98 - 107 mmol/L    CO2 TOTAL 26 21 - 31 mmol/L    ANION GAP 8 4 - 13 mmol/L    BUN 13 7 - 25 mg/dL    CREATININE 7.06 2.37 - 1.30 mg/dL    BUN/CREA RATIO 14 6 - 22    ESTIMATED GFR 66 >59 mL/min/1.76m^2    ALBUMIN 4.3 3.5 - 5.7 g/dL    CALCIUM 9.2 8.6 - 62.8 mg/dL    GLUCOSE 315 74 - 176 mg/dL    ALKALINE PHOSPHATASE 63 34 - 104 U/L    ALT (SGPT) 22 7 - 52 U/L    AST (SGOT) 34 13 - 39 U/L    BILIRUBIN TOTAL 0.6 0.3 - 1.0 mg/dL    PROTEIN TOTAL 7.0 6.4 - 8.9 g/dL    ALBUMIN/GLOBULIN RATIO 1.6 (H) 0.8 - 1.4    OSMOLALITY, CALCULATED 280 270 - 290 mOsm/kg    CALCIUM, CORRECTED 9.0 8.9 - 10.8 mg/dL    GLOBULIN 2.7 (L) 2.9 - 5.4    Narrative    Estimated Glomerular Filtration Rate (eGFR) is calculated using the CKD-EPI (2021) equation, intended for patients 36 years of age and older. If gender is not documented or "unknown", there will be no eGFR calculation.     PT/INR   Result Value Ref Range    PROTHROMBIN TIME 11.8 9.8 - 12.7 seconds    INR 1.01 0.84 - 1.10    Narrative    Coumadin therapy INR range for Conventional Anticoagulation is 2.0 to 3.0 and for Intensive Anticoagulation 2.5 to 3.5.   PTT (PARTIAL THROMBOPLASTIN TIME)   Result Value Ref Range    APTT 31.0 25.0 - 38.0 seconds    TROPONIN-I NOW   Result Value Ref Range    TROPONIN I 16 (H) <15 ng/L   TROPONIN-I IN ONE HOUR   Result Value Ref Range    TROPONIN I 15 (H) <15 ng/L   TROPONIN-I IN THREE HOURS   Result Value Ref Range    TROPONIN I 15 (H) <15 ng/L   CBC WITH DIFF   Result Value Ref Range    WBC 5.6 3.8 - 11.8 x10^3/uL    RBC 3.95  3.63 - 4.92 x10^6/uL    HGB 12.8 10.9 - 14.3 g/dL    HCT 16.1 09.6 - 04.5 %    MCV 95.3 75.5 - 95.3 fL    MCH 32.3 24.7 - 32.8 pg    MCHC 33.9 32.3 - 35.6 g/dL    RDW 40.9 81.1 - 91.4 %    PLATELETS 295 140 - 440 x10^3/uL    MPV 7.5 (L) 7.9 - 10.8 fL    NEUTROPHIL % 52 43 - 77 %    LYMPHOCYTE % 35 16 - 44 %    MONOCYTE % 8 5 - 13 %    EOSINOPHIL % 5 1 - 7 %    BASOPHIL % 1 0 - 1 %    NEUTROPHIL # 2.90 1.85 - 7.80 x10^3/uL    LYMPHOCYTE # 1.90 1.00 - 3.00 x10^3/uL    MONOCYTE # 0.40 0.30 - 1.00 x10^3/uL    EOSINOPHIL # 0.30 0.00 - 0.50 x10^3/uL    BASOPHIL # 0.00 0.00 - 0.10 x10^3/uL   B-TYPE NATRIURETIC PEPTIDE (BNP),PLASMA   Result Value Ref Range    BNP 118 (H) 1 - 100 pg/mL    Narrative                                Class 1: 101-250 pg/mL                              Class 2: 251-550 pg/mL                              Class 3: 551-900 pg/mL                              Class 4: >901 pg/mL     The [place] [employer] has developed a four-stage functional classification system for CHF that is based on a subjective interpretation of the severity of a patient's clinical signs and symptoms.    Class 1 - Patients have no limitations on physical activity and have no symptoms with ordinary physical activity.    Class 2 - Patients have a slight limitation of physical activity and have symptoms with ordinary physical activity.    Class 3 - Patients have a marked limitation of physical activity and have symptoms with less than ordinary physical activity, but not at rest.    Class 4 - Patients are unable to perform any physical activity without discomfort.   D-DIMER   Result Value Ref Range    D-DIMER  999 (HH) 215 - 500 ng/mL FEU    Narrative    D-Dimers are reported in FEU per ng/mL.    IF PATIENT IS EXHIBITING SYMPTOMS DVT/PE, THIS D-DIMER RESULT MAY INDICATE A NEED FOR FURTHER TESTING FOR THESE CONDITIONS. IF PATIENT IS SUSPECTED OF DIC AND SYMPTOMS WORSEN OR PERSIST, A REPEAT DIC WORKUP SHOULD BE CONSIDERED.    NOTE: ALTHOUGH THE NORMAL RANGE FOR THIS TEST IS 215-500 ng/mL FEU, LITERATURE RECOMMENDS FURTHER TESTING FOR ANY RESULT >500ng/mL FEU.    A cut off value of 500ng/mL FEU or below can be used as an aid in the diagnosis of Thromboembolism when used in conjunction with the patient's medical history, clinical presentation and other findings. Results of 500ng/mL FEU or below have  a negative predictive value of 100%.     CBC/DIFF    Narrative    The following orders were created for panel order CBC/DIFF.  Procedure                               Abnormality         Status                     ---------                               -----------         ------                     CBC WITH DIFF[[id number]]                Abnormal            Final result                 Please view results for these tests on the individual orders.   BASIC METABOLIC PANEL, NON-FASTING   Result Value Ref Range    SODIUM 140 136 - 145 mmol/L    POTASSIUM 3.4 (L) 3.5 - 5.1 mmol/L    CHLORIDE 108 (H) 98 - 107 mmol/L    CO2 TOTAL 23 21 - 31 mmol/L    ANION GAP 9 4 - 13 mmol/L    CALCIUM 8.7 8.6 - 10.3 mg/dL    GLUCOSE 130 (H) 74 - 109 mg/dL    BUN 14 7 - 25 mg/dL    CREATININE 8.65 7.84 - 1.30 mg/dL    BUN/CREA RATIO 16 6 - 22    ESTIMATED GFR 70 >59 mL/min/1.7m^2    OSMOLALITY, CALCULATED 281 270 - 290 mOsm/kg    Narrative    Estimated Glomerular Filtration Rate (eGFR) is calculated using the CKD-EPI (2021) equation, intended for patients 33 years of age and older. If gender is not documented or "unknown", there will be no eGFR calculation.     MAGNESIUM   Result Value Ref Range    MAGNESIUM 2.0 1.9 - 2.7 mg/dL   CBC WITH DIFF    Result Value Ref Range    WBC 5.7 3.8 - 11.8 x10^3/uL    RBC 3.65 3.63 - 4.92 x10^6/uL    HGB 11.9 10.9 - 14.3 g/dL    HCT 69.6 29.5 - 28.4 %    MCV 95.2 75.5 - 95.3 fL    MCH 32.7 24.7 - 32.8 pg    MCHC 34.3 32.3 - 35.6 g/dL    RDW 13.2 44.0 - 10.2 %    PLATELETS 250 140 - 440 x10^3/uL    MPV 7.8 (L) 7.9 - 10.8 fL    NEUTROPHIL % 50 43 - 77 %    LYMPHOCYTE % 34 16 - 44 %    MONOCYTE % 10 5 - 13 %    EOSINOPHIL % 6 1 - 7 %    BASOPHIL % 1 0 - 1 %    NEUTROPHIL # 2.80 1.85 - 7.80 x10^3/uL    LYMPHOCYTE # 1.90 1.00 - 3.00 x10^3/uL    MONOCYTE # 0.60 0.30 - 1.00 x10^3/uL    EOSINOPHIL # 0.30 0.00 - 0.50 x10^3/uL    BASOPHIL # 0.10 0.00 - 0.10 x10^3/uL  Cardiopulmonary/Radiology:  CT ANGIO CHEST FOR PULMONARY EMBOLUS W IV CONTRAST    Result Date: 01/29/2023  Impression Negative for pulmonary embolus or acute infiltrate. One or more dose reduction techniques were used (e.g., Automated exposure control, adjustment of the mA and/or kV according to patient size, use of iterative reconstruction technique). Radiologist location ID: [id number]     CT BRAIN WO IV CONTRAST    Result Date: 01/29/2023  Impression NO ACUTE FINDINGS One or more dose reduction techniques were used (e.g., Automated exposure control, adjustment of the mA and/or kV according to patient size, use of iterative reconstruction technique). Radiologist location ID: [id number]     XR CHEST PA AND LATERAL    Result Date: 01/29/2023  Impression NO ACUTE FINDINGS. Radiologist location ID: [id number]     CT BRAIN WO IV CONTRAST    Result Date: 01/19/2023  Impression CHRONIC CHANGES.  NO ACUTE FINDINGS. One or more dose reduction techniques were used (e.g., Automated exposure control, adjustment of the mA and/or kV according to patient size, use of iterative reconstruction technique). Radiologist location ID: [id number]     CT ANGIO CHEST ABDOMEN PELVIS W IV CONTRAST    Result Date: 01/19/2023  Impression NO ACUTE VASCULAR ABNORMALITIES OR HIGH-GRADE  STENOSIS NO ACUTE MESENTERIC INFLAMMATION, FREE FLUID OR FREE AIR IN THE ABDOMEN NO ACUTE PULMONARY DISEASE One or more dose reduction techniques were used (e.g., Automated exposure control, adjustment of the mA and/or kV according to patient size, use of iterative reconstruction technique). Radiologist location ID: [id number]     XR AP MOBILE CHEST    Result Date: 01/19/2023  Impression NO ACUTE FINDINGS. Radiologist location ID: [id number]     XR ANKLE RIGHT    Result Date: 01/14/2023  Impression 72 yo female presents with right lower leg pain after a swing broke on her while her foot was underneath the swing and scraped the back of her leg on 8.31 Independent review of x-rays taken in the OV clinic today. AP, lateral, and mortise views of the right ankle shows no fracture or dislocation.  Mortise appears to be symmetric.  No lytic lesion, significant soft tissue edema, and normal bony mineralization.  Spurring of the inferior and posterior calcaneus.    XR HIP RIGHT W PELVIS 2-3 VIEWS    Result Date: 01/04/2023  Impression Right hip arthroplasty. No acute fracture. Radiologist location ID: [id number]     XR TIBIA-FIBULA RIGHT    Result Date: 01/04/2023  Impression NO ACUTE FRACTURE OR DISLOCATION. Radiologist location ID: [id number]      No results found for this or any previous visit (from the past 2400 hour(s)).     PHYSICAL EXAMINATION:  BP (!) 148/93   Pulse 71   Temp 36.5 C (97.7 F)   Resp 17   Ht 1.676 m (5' 5.98")   Wt 99.4 kg (219 lb 1.6 oz)   SpO2 96%   BMI 35.38 kg/m     General: No acute distress and appears stated age.    HEENT: Head normocephalic, atraumatic.  Mucouse membranes moist.    Neck: No JVD, no carotid bruit.    Lungs: Clear to auscultation bilaterally.    Cardiovascular: Regular rate and rhythm, normal S1-S2 without murmur, no gallop or rub.    Abdomen: Soft, bowel sounds normal.    Extremities: No edema.  Skin: Skin warm and dry.    Neurologic: Alert and oriented x3.  Psych:  Mood and affect congruent for age and gender  I participated and evaluated the patient as part of a collaborative telemedicine service.  See Dr. Despina Hidden 's addendum for additional information.  My findings from this visit are as stated above.  The co-signing physician did not participate in the management of this patient unless otherwise noted.      Hassel Neth, PA-C 01/30/2023 10:34      I personally saw and evaluated the patient. See mid-level's note for additional details. My findings/participation are if cath report shows non-obstructive CAD, if this is the case, chest pain not cardiac, need to look at other causes. Defer to primary team.     John Giovanni, DO

## 2023-01-31 DIAGNOSIS — G4730 Sleep apnea, unspecified: Secondary | ICD-10-CM

## 2023-01-31 DIAGNOSIS — I25110 Atherosclerotic heart disease of native coronary artery with unstable angina pectoris: Secondary | ICD-10-CM

## 2023-01-31 DIAGNOSIS — G629 Polyneuropathy, unspecified: Secondary | ICD-10-CM

## 2023-01-31 DIAGNOSIS — K219 Gastro-esophageal reflux disease without esophagitis: Secondary | ICD-10-CM

## 2023-01-31 DIAGNOSIS — G2581 Restless legs syndrome: Secondary | ICD-10-CM

## 2023-01-31 DIAGNOSIS — R32 Unspecified urinary incontinence: Secondary | ICD-10-CM

## 2023-01-31 DIAGNOSIS — M329 Systemic lupus erythematosus, unspecified: Secondary | ICD-10-CM

## 2023-01-31 DIAGNOSIS — F32A Depression, unspecified: Secondary | ICD-10-CM

## 2023-01-31 DIAGNOSIS — E039 Hypothyroidism, unspecified: Secondary | ICD-10-CM

## 2023-01-31 LAB — CBC WITH DIFF
BASOPHIL #: 0 10*3/uL (ref 0.00–0.10)
BASOPHIL %: 1 % (ref 0–1)
EOSINOPHIL #: 0.2 10*3/uL (ref 0.00–0.50)
EOSINOPHIL %: 5 % (ref 1–7)
HCT: 35.6 % (ref 31.2–41.9)
HGB: 12.3 g/dL (ref 10.9–14.3)
LYMPHOCYTE #: 1.8 10*3/uL (ref 1.00–3.00)
LYMPHOCYTE %: 39 % (ref 16–44)
MCH: 32.9 pg — ABNORMAL HIGH (ref 24.7–32.8)
MCHC: 34.6 g/dL (ref 32.3–35.6)
MCV: 95.1 fL (ref 75.5–95.3)
MONOCYTE #: 0.4 10*3/uL (ref 0.30–1.00)
MONOCYTE %: 9 % (ref 5–13)
MPV: 7.6 fL — ABNORMAL LOW (ref 7.9–10.8)
NEUTROPHIL #: 2.2 10*3/uL (ref 1.85–7.80)
NEUTROPHIL %: 46 % (ref 43–77)
PLATELETS: 241 10*3/uL (ref 140–440)
RBC: 3.75 10*6/uL (ref 3.63–4.92)
RDW: 14.1 % (ref 12.3–17.7)
WBC: 4.7 10*3/uL (ref 3.8–11.8)

## 2023-01-31 LAB — COMPREHENSIVE METABOLIC PANEL, NON-FASTING
ALBUMIN/GLOBULIN RATIO: 1.3 (ref 0.8–1.4)
ALBUMIN: 3.5 g/dL (ref 3.5–5.7)
ALKALINE PHOSPHATASE: 57 U/L (ref 34–104)
ALT (SGPT): 16 U/L (ref 7–52)
ANION GAP: 10 mmol/L (ref 4–13)
AST (SGOT): 24 U/L (ref 13–39)
BILIRUBIN TOTAL: 0.5 mg/dL (ref 0.3–1.0)
BUN/CREA RATIO: 15 (ref 6–22)
BUN: 11 mg/dL (ref 7–25)
CALCIUM, CORRECTED: 8.9 mg/dL (ref 8.9–10.8)
CALCIUM: 8.5 mg/dL — ABNORMAL LOW (ref 8.6–10.3)
CHLORIDE: 109 mmol/L — ABNORMAL HIGH (ref 98–107)
CO2 TOTAL: 19 mmol/L — ABNORMAL LOW (ref 21–31)
CREATININE: 0.74 mg/dL (ref 0.60–1.30)
ESTIMATED GFR: 86 mL/min/{1.73_m2} (ref >59)
GLOBULIN: 2.7 — ABNORMAL LOW (ref 2.9–5.4)
GLUCOSE: 111 mg/dL — ABNORMAL HIGH (ref 74–109)
OSMOLALITY, CALCULATED: 276 mosm/kg (ref 270–290)
POTASSIUM: 3.8 mmol/L (ref 3.5–5.1)
PROTEIN TOTAL: 6.2 g/dL — ABNORMAL LOW (ref 6.4–8.9)
SODIUM: 138 mmol/L (ref 136–145)

## 2023-01-31 LAB — TROPONIN-I: TROPONIN I: 8 ng/L (ref <15)

## 2023-01-31 LAB — HEP-2 SUBSTRATE ANTINUCLEAR ANTIBODIES (ANA), SERUM: ANA INTERPRETATION: NEGATIVE

## 2023-01-31 LAB — MAGNESIUM: MAGNESIUM: 1.9 mg/dL (ref 1.9–2.7)

## 2023-01-31 LAB — THYROID STIMULATING HORMONE WITH FREE T4 REFLEX: TSH: 2.714 u[IU]/mL (ref 0.450–5.330)

## 2023-01-31 LAB — LYME ANTIBODY PANEL WITH REFLEX: LYME ANTIBODY TOTAL (Screen): NEGATIVE

## 2023-01-31 MED ORDER — METHYLPREDNISOLONE SOD SUCC 125 MG SOLUTION FOR INJECTION WRAPPER
125.0000 mg | Freq: Once | INTRAVENOUS | Status: AC
Start: 2023-01-31 — End: 2023-01-31
  Administered 2023-01-31: 125 mg via INTRAVENOUS
  Filled 2023-01-31: qty 2

## 2023-01-31 MED ORDER — BUSPIRONE 5 MG TABLET
7.5000 mg | ORAL_TABLET | Freq: Two times a day (BID) | ORAL | Status: DC
Start: 2023-01-31 — End: 2023-02-01
  Administered 2023-01-31 (×2): 7.5 mg via ORAL
  Filled 2023-01-31 (×3): qty 2

## 2023-01-31 NOTE — Nurses Notes (Signed)
Patient complains of "burning" around IV site after IV solu-medrol given. IV was flushed and patient still complains of burning and pain. Dressing removed and minimal redness noted around IV insertion site. Patient asked for IV to be removed. I removed IV per her request, catheter still intact.

## 2023-01-31 NOTE — Progress Notes (Signed)
01/31/2023  12:30  Judy Hicks  [id number]    History: 72 yr old female with PMH of HTN, GERD, possible history of Lupus, RLS, CAD, hypothyroidism, who presented to the hospital with chest pain and significant shortness of breath. Patient had recent heart cath at [facility] and was supposedly found to have 40-50% blockage but waiting on official reports to confirm. States has had significant anxiety recently with loss of her grandchild who was murdered and other life events that have added stress.     Today:  Patient had further events of chest pain overnight, relieved by nitro.  EKG and troponin were benign overall.  Unclear the etiology of her chest discomfort.  She also has complaints of feeling like she is short of breath and feeling like her throat feels weird.  She has not been started on any new medications, does not have any rash consistent with underlying allergic reaction.  Lyme panel did come back negative today.    Past Medical History  Cholecalciferol (Vitamin D3), aspirin, atorvastatin, escitalopram oxalate, furosemide, gabapentin, isosorbide mononitrate, levothyroxine, losartan, meclizine, metoprolol succinate, mirabegron, omeprazole, ondansetron, rOPINIRole, traMADol-acetaminophen, and traZODone   Allergies   Allergen Reactions    Azithromycin Itching, Rash, Shortness of Breath and Swelling     Other reaction(s): Other (see comments), Sweating (intolerance)    Gemfibrozil Itching, Rash, Shortness of Breath and Swelling     Other reaction(s): Other (see comments), Other (See Comments), Other - See Comments  Joint pain and fatigue   Joint pain and fatigue   Joint pain and fatigue   Joint pain and fatigue       Lisinopril Itching, Rash, Shortness of Breath and Swelling     Other reaction(s): Other (see comments)    Penicillins Shortness of Breath    Statins-Hmg-Coa Reductase Inhibitors Shortness of Breath    Levofloxacin Rash     Other reaction(s): Other (see comments)    Niacin Rash    Strawberry Itching and  Rash    Ezetimibe Hives/ Urticaria     Other reaction(s): Other (see comments)     Past Medical History:   Diagnosis Date    CAD (coronary artery disease) 09/24/2021    Depression 09/24/2021    Essential hypertension 09/24/2021    GERD (gastroesophageal reflux disease)     History of MI (myocardial infarction) 09/24/2021    HTN (hypertension)     Hyperlipidemia 09/24/2021    Lupus (CMS HCC) 09/24/2021    Lyme disease     Narcolepsy     Neuropathy (CMS HCC) 09/24/2021    Restless legs syndrome     Sleep apnea 09/24/2021    Thyroid disease 09/24/2021    Urinary incontinence 09/24/2021         Past Surgical History:   Procedure Laterality Date    CARDIAC CATHETERIZATION      CATARACT EXTRACTION, BILATERAL      HX APPENDECTOMY      HX BUNIONECTOMY Left     HX HIP REPLACEMENT Bilateral     HX HYSTERECTOMY           Family Medical History:       Problem Relation (Age of Onset)    Colon Cancer Mother    Diabetes Other    Esophageal cancer Father    Hypertension (High Blood Pressure) Other    Liver Cancer Mother            Social History     Socioeconomic History    Marital  status: Married   Tobacco Use    Smoking status: Never    Smokeless tobacco: Never   Vaping Use    Vaping status: Never Used   Substance and Sexual Activity    Alcohol use: Never    Drug use: Never     Social Determinants of Health     Social Connections: Low Risk  (01/30/2023)    Social Connections     SDOH Social Isolation: 5 or more times a week       Physical exam:  BP (!) 149/102   Pulse 73   Temp 36.6 C (97.9 F)   Resp 18   Ht 1.676 m (5' 5.98")   Wt 101 kg (222 lb 0.7 oz)   SpO2 97%   BMI 35.86 kg/m       Gen: This is a 72 y.o. female who is awake alert and oriented x3 in no acute distress  CV:  Regular rate and rhythm without murmurs rubs or gallops.  2+ pedal and radial pulses. No clubbing, cyanosis, or edema.  RESP:  Clear to auscultation bilaterally without wheezes rales or rhonchi.  Respirations are nonlabored.  GI:  Abdomen is soft,  nontender, nondistended.  Bowel sounds normoactive.  GU: No foley is present  MSK:  Full range of motion of upper and lower extremities  NEURO:  Cranial nerves 2-12 grossly intact.  No focal deficits as tested.  SKIN: Warm, dry, intact, without lesions, rashes, or ulcerations    Diagnostic Labs:  Results for orders placed or performed during the hospital encounter of 01/29/23 (from the past 24 hour(s))   LYME ANTIBODY PANEL WITH REFLEX   Result Value Ref Range    LYME ANTIBODY TOTAL (Screen) Negative Negative    Narrative    This Lyme Total assay comprises the first-tier of a modified two-tier testing approach and incorporates recombinant Borrelia antigens (VlsE and OspC).  The test is performed on a [employer] Liaison XL analytic system using FDA-cleared protocols and reagents.     TROPONIN-I   Result Value Ref Range    TROPONIN I 8 <15 ng/L   CBC/DIFF    Narrative    The following orders were created for panel order CBC/DIFF.  Procedure                               Abnormality         Status                     ---------                               -----------         ------                     CBC WITH DIFF[[id number]]                Abnormal            Final result                 Please view results for these tests on the individual orders.   COMPREHENSIVE METABOLIC PANEL, NON-FASTING   Result Value Ref Range    SODIUM 138 136 - 145 mmol/L    POTASSIUM 3.8 3.5 - 5.1 mmol/L    CHLORIDE 109 (H) 98 -  107 mmol/L    CO2 TOTAL 19 (L) 21 - 31 mmol/L    ANION GAP 10 4 - 13 mmol/L    BUN 11 7 - 25 mg/dL    CREATININE 2.44 0.10 - 1.30 mg/dL    BUN/CREA RATIO 15 6 - 22    ESTIMATED GFR 86 >59 mL/min/1.40m^2    ALBUMIN 3.5 3.5 - 5.7 g/dL    CALCIUM 8.5 (L) 8.6 - 10.3 mg/dL    GLUCOSE 272 (H) 74 - 109 mg/dL    ALKALINE PHOSPHATASE 57 34 - 104 U/L    ALT (SGPT) 16 7 - 52 U/L    AST (SGOT) 24 13 - 39 U/L    BILIRUBIN TOTAL 0.5 0.3 - 1.0 mg/dL    PROTEIN TOTAL 6.2 (L) 6.4 - 8.9 g/dL    ALBUMIN/GLOBULIN RATIO 1.3 0.8 - 1.4     OSMOLALITY, CALCULATED 276 270 - 290 mOsm/kg    CALCIUM, CORRECTED 8.9 8.9 - 10.8 mg/dL    GLOBULIN 2.7 (L) 2.9 - 5.4    Narrative    Estimated Glomerular Filtration Rate (eGFR) is calculated using the CKD-EPI (2021) equation, intended for patients 15 years of age and older. If gender is not documented or "unknown", there will be no eGFR calculation.     MAGNESIUM   Result Value Ref Range    MAGNESIUM 1.9 1.9 - 2.7 mg/dL   CBC WITH DIFF   Result Value Ref Range    WBC 4.7 3.8 - 11.8 x10^3/uL    RBC 3.75 3.63 - 4.92 x10^6/uL    HGB 12.3 10.9 - 14.3 g/dL    HCT 53.6 64.4 - 03.4 %    MCV 95.1 75.5 - 95.3 fL    MCH 32.9 (H) 24.7 - 32.8 pg    MCHC 34.6 32.3 - 35.6 g/dL    RDW 74.2 59.5 - 63.8 %    PLATELETS 241 140 - 440 x10^3/uL    MPV 7.6 (L) 7.9 - 10.8 fL    NEUTROPHIL % 46 43 - 77 %    LYMPHOCYTE % 39 16 - 44 %    MONOCYTE % 9 5 - 13 %    EOSINOPHIL % 5 1 - 7 %    BASOPHIL % 1 0 - 1 %    NEUTROPHIL # 2.20 1.85 - 7.80 x10^3/uL    LYMPHOCYTE # 1.80 1.00 - 3.00 x10^3/uL    MONOCYTE # 0.40 0.30 - 1.00 x10^3/uL    EOSINOPHIL # 0.20 0.00 - 0.50 x10^3/uL    BASOPHIL # 0.00 0.00 - 0.10 x10^3/uL   THYROID STIMULATING HORMONE WITH FREE T4 REFLEX   Result Value Ref Range    TSH 2.714 0.450 - 5.330 uIU/mL       Diagnostic Imaging:  CT BRAIN WO IV CONTRAST   Final Result   NO ACUTE FINDINGS         One or more dose reduction techniques were used (e.g., Automated exposure control, adjustment of the mA and/or kV according to patient size, use of iterative reconstruction technique).         Radiologist location ID: [id number]         CT ANGIO CHEST FOR PULMONARY EMBOLUS W IV CONTRAST   Final Result   Negative for pulmonary embolus or acute infiltrate.         One or more dose reduction techniques were used (e.g., Automated exposure control, adjustment of the mA and/or kV according to patient size, use of iterative reconstruction technique).  Radiologist location ID: [id number]         XR CHEST PA AND LATERAL   Final  Result   NO ACUTE FINDINGS.         Radiologist location ID: [id number]              Assesment/Plan:  Patient Active Problem List   Diagnosis    Urinary incontinence    CAD (coronary artery disease)    Depression    Essential hypertension    GERD (gastroesophageal reflux disease)    History of MI (myocardial infarction)    Hyperlipidemia    Lupus (CMS HCC)    Neuropathy (CMS HCC)    Sleep apnea    Thyroid disease    Chest pain    Unstable angina (CMS HCC)        Chest Pain  Relieved by nitro  Already on Imdur  Unclear if cardiac or other etiology  PE and other resp disease ruled out as cause of shortness of breath  Waiting on [facility] records  Ordered further testing for possible autoimmune disorder   Questionable pleuritis from lupus type disease  Did give 1 time dose of Solu-Medrol    DVT Prophylaxis: Lovenox  Diet: Cardiac  Physical Therapy: Consulted  Disposition: Home    On the day of the encounter, a total of  45 minutes was spent on this patient encounter including review of historical information, examination, documentation and post-visit activities. The time documented excludes procedural time.    Ebbie Ridge, DO

## 2023-01-31 NOTE — Nurses Notes (Signed)
Provider came to see patient. Stat EKG and Troponin ordered. Patient states chest pain has decreased after nitro x 2. Will continue to closely monitor status.

## 2023-02-01 DIAGNOSIS — R9431 Abnormal electrocardiogram [ECG] [EKG]: Secondary | ICD-10-CM

## 2023-02-01 DIAGNOSIS — I440 Atrioventricular block, first degree: Secondary | ICD-10-CM

## 2023-02-01 DIAGNOSIS — J45909 Unspecified asthma, uncomplicated: Secondary | ICD-10-CM

## 2023-02-01 DIAGNOSIS — F419 Anxiety disorder, unspecified: Secondary | ICD-10-CM

## 2023-02-01 LAB — ECG 12 LEAD
Atrial Rate: 70 {beats}/min
Calculated P Axis: 42 degrees
Calculated R Axis: 16 degrees
Calculated R Axis: 17 degrees
Calculated T Axis: 30 degrees
Calculated T Axis: 25 degrees
PR Interval: 208 ms
QRS Duration: 80 ms
QRS Duration: 68 ms
QT Interval: 436 ms
QT Interval: 422 ms
QTC Calculation: 470 ms
QTC Calculation: 474 ms
Ventricular rate: 70 {beats}/min
Ventricular rate: 76 {beats}/min

## 2023-02-01 MED ORDER — GABAPENTIN 100 MG CAPSULE
100.0000 mg | ORAL_CAPSULE | Freq: Three times a day (TID) | ORAL | 0 refills | Status: AC | PRN
Start: 2023-02-01 — End: ?

## 2023-02-01 NOTE — Discharge Summary (Signed)
[facility]  DISCHARGE SUMMARY    PATIENT NAME:  Judy Hicks, Judy Hicks  MRN:  [id number]  DOB:  August 02, 1950    ENCOUNTER DATE:  01/29/2023  INPATIENT ADMISSION DATE:   DISCHARGE DATE:  02/01/2023    ATTENDING PHYSICIAN: No att. providers found  SERVICE: PRN HOSPITALIST 4  PRIMARY CARE PHYSICIAN: Emilie Rutter, MD       No lay caregiver identified.    PRIMARY DISCHARGE DIAGNOSIS: Chest pain  Active Hospital Problems    Diagnosis Date Noted    Principal Problem: Chest pain [R07.9] 01/29/2023    Unstable angina (CMS HCC) [I20.0] 01/29/2023    Hyperlipidemia [E78.5] 09/24/2021    Essential hypertension [I10] 09/24/2021    CAD (coronary artery disease) [I25.10] 09/24/2021      Resolved Hospital Problems   No resolved problems to display.     Active Non-Hospital Problems    Diagnosis Date Noted    Urinary incontinence 09/24/2021    Depression 09/24/2021    GERD (gastroesophageal reflux disease) 09/24/2021    History of MI (myocardial infarction) 09/24/2021    Lupus (CMS HCC) 09/24/2021    Neuropathy (CMS HCC) 09/24/2021    Sleep apnea 09/24/2021    Thyroid disease 09/24/2021           Current Discharge Medication List        CONTINUE these medications - NO CHANGES were made during your visit.        Details   aspirin 81 mg Tablet, Chewable   1 Tablet (81 mg total) Once a day  Refills: 0     atorvastatin 40 mg Tablet  Commonly known as: LIPITOR   Take 1 Tablet (40 mg total) by mouth Once a day  Refills: 0     Cholecalciferol (Vitamin D3) 50 mcg (2,000 unit) Capsule   Take 1 Capsule by mouth Once a day  Refills: 0     escitalopram oxalate 10 mg Tablet  Commonly known as: LEXAPRO   Take 1 Tablet (10 mg total) by mouth Once a day  Refills: 0     furosemide 20 mg Tablet  Commonly known as: LASIX   Take 1 Tablet (20 mg total) by mouth Once a day  Refills: 0     gabapentin 100 mg Capsule  Commonly known as: NEURONTIN   Take 1 Capsule (100 mg total) by mouth  Refills: 0     isosorbide mononitrate 60 mg Tablet Sustained  Release 24 hr  Commonly known as: IMDUR   Take 1 Tablet (60 mg total) by mouth Every morning  Refills: 0     levothyroxine 25 mcg Tablet  Commonly known as: SYNTHROID   Take 1 Tablet (25 mcg total) by mouth Every morning  Refills: 0     losartan 100 mg Tablet  Commonly known as: COZAAR   Take 1 Tablet (100 mg total) by mouth Once a day  Refills: 0     meclizine 25 mg Tablet  Commonly known as: ANTIVERT   Take 1 Tablet (25 mg total) by mouth Every 12 hours as needed  Qty: 14 Tablet  Refills: 0     metoprolol succinate 25 mg Tablet Sustained Release 24 hr  Commonly known as: TOPROL-XL   Take 1 Tablet (25 mg total) by mouth Once a day  Refills: 0     mirabegron 25 mg Tablet Sustained Release 24 hr  Commonly known as: MYRBETRIQ   Take 1 Tablet (25 mg total) by mouth Once a day  Refills: 0     omeprazole 20 mg Capsule, Delayed Release(E.C.)  Commonly known as: PRILOSEC   Take 1 Capsule (20 mg total) by mouth Once a day  Refills: 0     ondansetron 4 mg Tablet  Commonly known as: ZOFRAN   Take 1 Tablet (4 mg total) by mouth Every 8 hours as needed for Nausea/Vomiting  Refills: 0     rOPINIRole 1 mg Tablet  Commonly known as: REQUIP   Take 2 Tablets (2 mg total) by mouth Twice daily  Refills: 0     traMADol-acetaminophen 37.5-325 mg Tablet  Commonly known as: ULTRACET   Take 1 Tablet by mouth Every 6 hours as needed  Qty: 18 Tablet  Refills: 0     traZODone 50 mg Tablet  Commonly known as: DESYREL   Take 1 Tablet (50 mg total) by mouth Every night  Refills: 0            Discharge med list refreshed?  YES     Allergies   Allergen Reactions    Azithromycin Itching, Rash, Shortness of Breath and Swelling     Other reaction(s): Other (see comments), Sweating (intolerance)    Gemfibrozil Itching, Rash, Shortness of Breath and Swelling     Other reaction(s): Other (see comments), Other (See Comments), Other - See Comments  Joint pain and fatigue   Joint pain and fatigue   Joint pain and fatigue   Joint pain and fatigue        Lisinopril Itching, Rash, Shortness of Breath and Swelling     Other reaction(s): Other (see comments)    Penicillins Shortness of Breath    Statins-Hmg-Coa Reductase Inhibitors Shortness of Breath    Levofloxacin Rash     Other reaction(s): Other (see comments)    Niacin Rash    Strawberry Itching and Rash    Ezetimibe Hives/ Urticaria     Other reaction(s): Other (see comments)     HOSPITAL PROCEDURE(S):   No orders of the defined types were placed in this encounter.      REASON FOR HOSPITALIZATION AND HOSPITAL COURSE   BRIEF HPI:  This is a 72 yr old female with PMH of HTN, GERD, possible history of Lupus, RLS, CAD, hypothyroidism, who presented to the hospital with chest pain and significant shortness of breath. Patient had recent heart cath at [facility] and was supposedly found to have 40-50% blockage but waiting on official reports to confirm. States has had significant anxiety recently with loss of her grandchild who was murdered and other life events that have added stress. Cardiology was consulted and recommended no further workup at this time. We tested her for lupus which ANA was negative. Complement levels were WNL. Lymes test was negative. She states she is feeling much better now. I informed her that she need to follow up with cardiology. She also has multiple inhalers at home and told us that she actually has been diagnosed with asthma in the past and was not taking her medications. This would explain her persistent shortness of breath. I have her dose of steroids and her dyspnea improved. She states she has all of her medications at home and will start taking them again.         LINES/DRAINS/WOUNDS AT DISCHARGE:   Patient Lines/Drains/Airways Status       Active Line / Dialysis Catheter / Dialysis Graft / Drain / Airway / Wound       Name Placement date Placement time Site  Days    Peripheral IV Right Basilic  (medial side of arm) 01/31/23  1834  -- less than 1    External Urinary Catheter 01/30/23  1837   -- 1                    DISCHARGE DISPOSITION:  Home discharge  DISCHARGE INSTRUCTIONS:  Post-Discharge Follow Up Appointments       Follow up with Emilie Rutter, MD in 1 week    Phone: [phone]    Where: [address], [place] [address]          No discharge procedures on file.       Ebbie Ridge, DO    Copies sent to Care Team         Relationship Specialty Notifications Start End    Emilie Rutter, MD PCP - General FAMILY MEDICINE  11/12/21     Phone: [phone] Fax: [phone]         [address] [place] [address]            Referring providers can utilize [email or web address] to access their referred [facility] patient's information.

## 2023-02-01 NOTE — Nurses Notes (Signed)
Patient discharged home with family.  AVS reviewed with patient/care giver.  A written copy of the AVS and discharge instructions was given to the patient/care giver. Questions sufficiently answered as needed.  Patient/care giver encouraged to follow up with PCP as indicated.  In the event of an emergency, patient/care giver instructed to call 911 or go to the nearest emergency room. IV and tele monitor removed. All patient belongings are within her possession at this time. Patient taken downstairs via wheelchair.

## 2023-02-04 LAB — C4 COMPLEMENT, SERUM: C4 COMPLEMENT: 23 mg/dL (ref 12–39)

## 2023-02-04 LAB — SS-A/RO ANTIBODIES, IGG, SERUM: SS-A/RO, ANTIBODIES, IGG QUALITATIVE: NEGATIVE

## 2023-02-04 LAB — C3 COMPLEMENT, SERUM: C3 COMPLEMENT: 143 mg/dL (ref 81–157)

## 2023-03-04 IMAGING — MR MRI ANKLE RT WO CONTRAST
4 of 6 series · 23 of 40 positions shown · IV contrast (gadolinium)
Comparison: Report of outside x-ray dated 01/14/2023.

﻿EXAM:  [DATE]   MRI ANKLE RT WO CONTRAST,MRI FOOT RT WO CONTRAST
INDICATION: 72-year-old sustained trauma on [DATE]st, landing on her foot and ankle.  Pain predominantly in the posterior aspect of the foot and ankle.  No history of prior surgery.
TECHNIQUE: Multiplanar, multisequential MRI of the right ankle and right foot was performed without gadolinium contrast.

[Series 9: T1 · sagittal · right · 3.5mm · 0.40mm/px · 6 of 20 slices shown (1 of 3)]
[im 1/20]
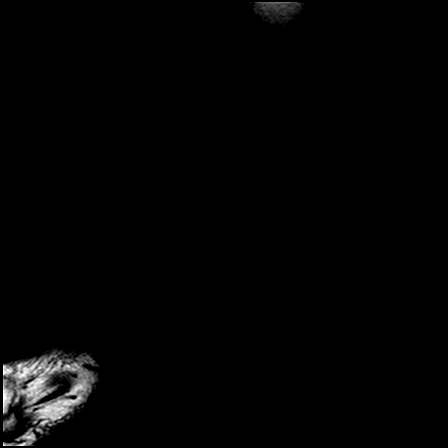
[im 4/20]
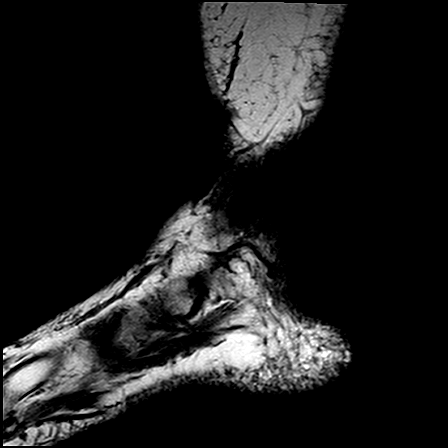
[im 8/20]
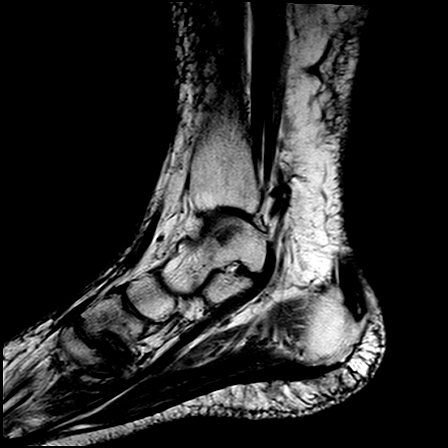
[im 12/20]
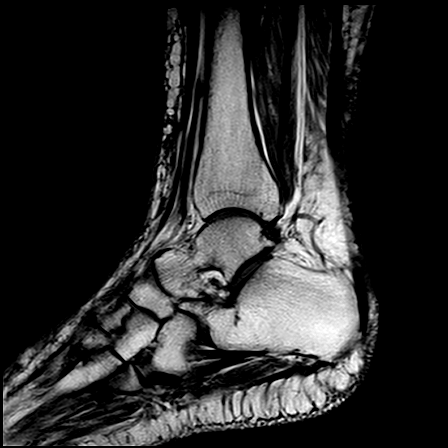
[im 16/20]
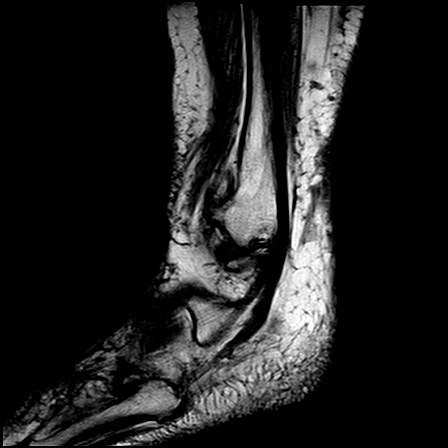
[im 20/20]
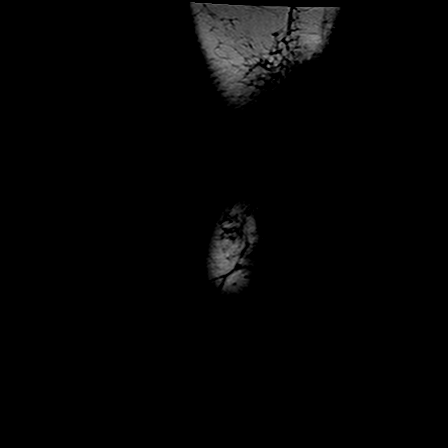

[Series 11: T1 · axial · right · 5.0mm · 0.31mm/px · z∈[-65,+65]mm · 6 of 30 slices shown (2 of 3)]
[im 1/30]
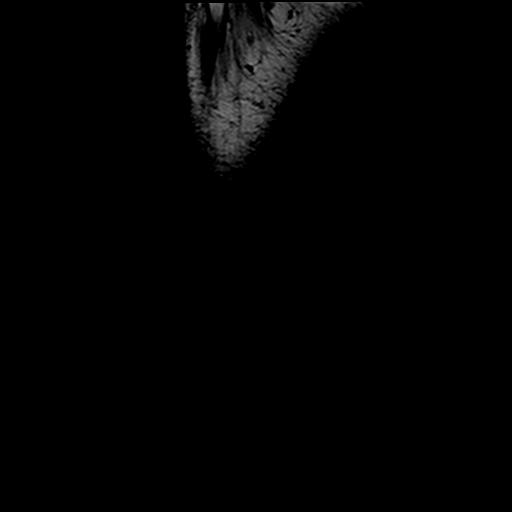
[im 5/30]
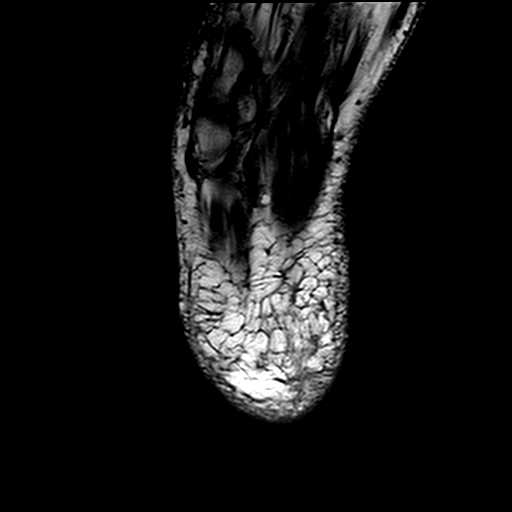
[im 9/30]
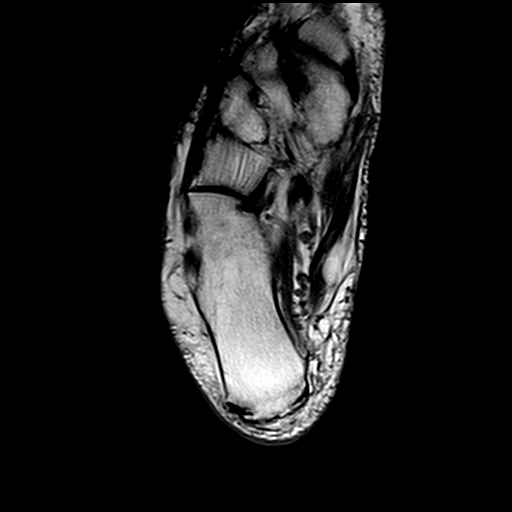
[im 13/30]
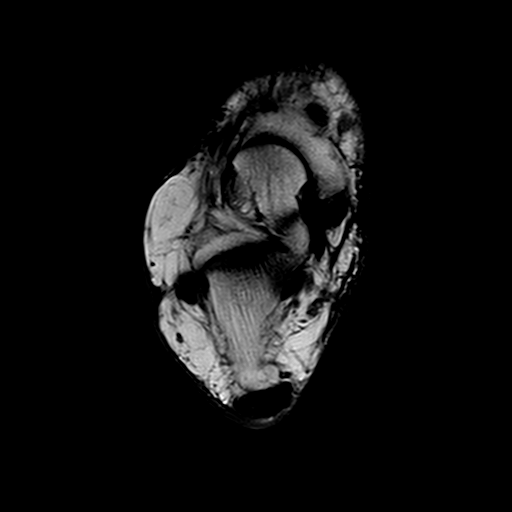
[im 17/30]
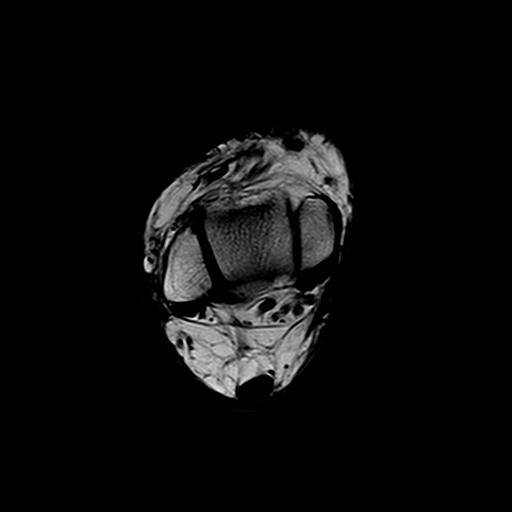
[im 25/30]
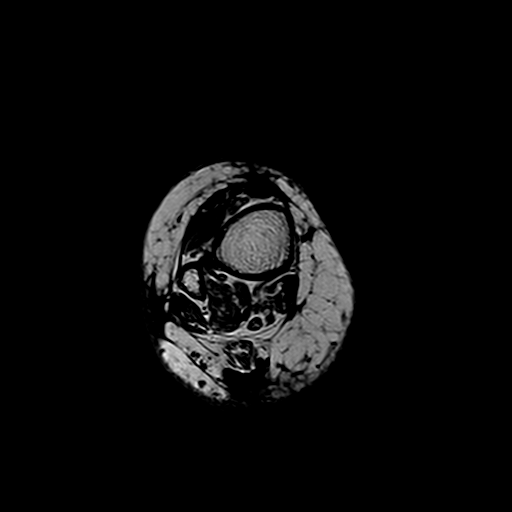

[Series 12: T2 fat-sat · axial · right · 5.0mm · 0.36mm/px · z∈[-65,+92]mm · 8 of 30 slices shown]
[im 1/30]
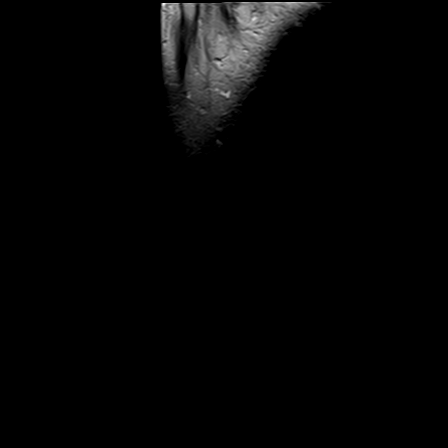
[im 5/30]
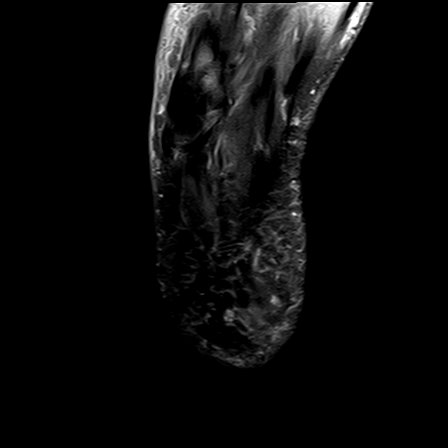
[im 9/30]
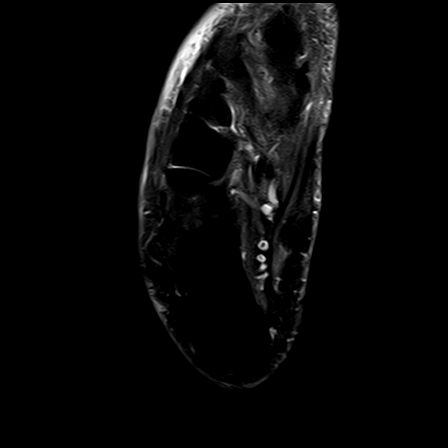
[im 13/30]
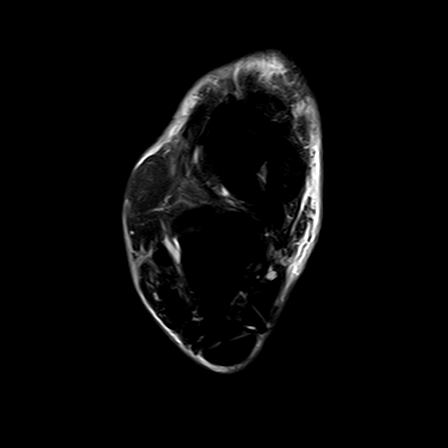
[im 17/30]
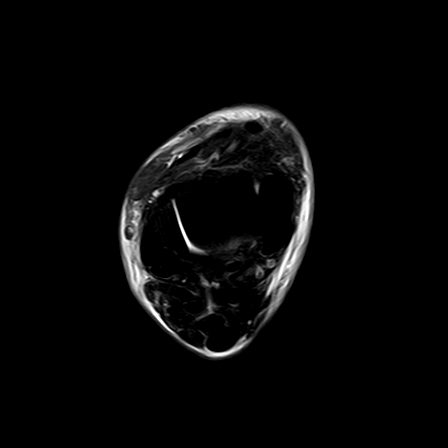
[im 21/30]
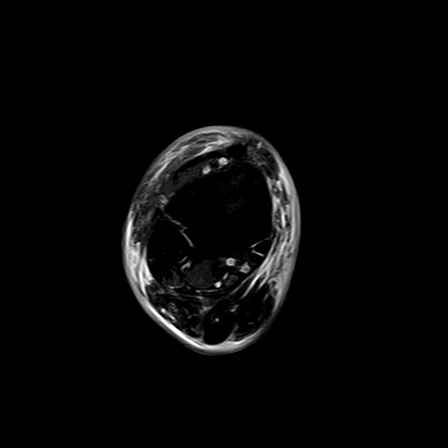
[im 25/30]
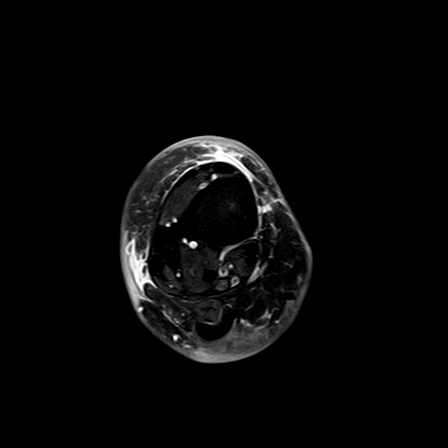
[im 30/30]
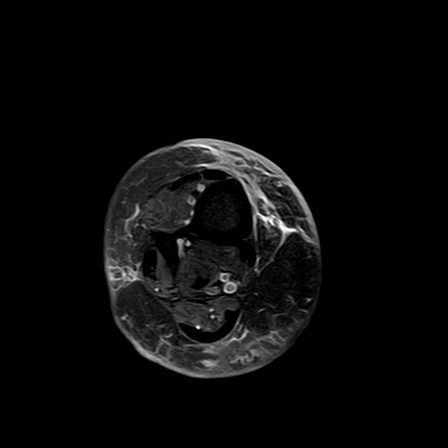

[Series 14: T1 · coronal · right · 5.0mm · 0.33mm/px · 3 of 23 slices shown (3 of 3)]
[im 5/23]
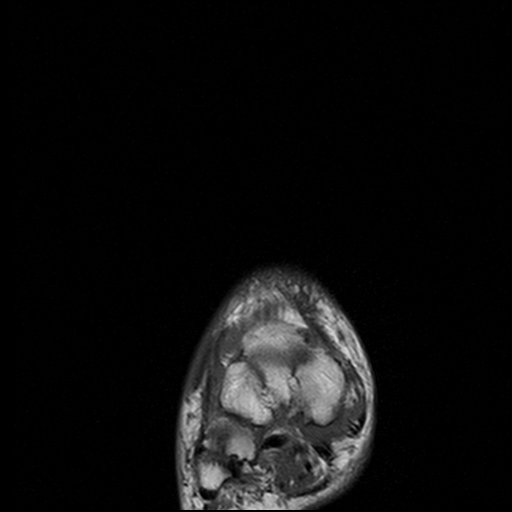
[im 14/23]
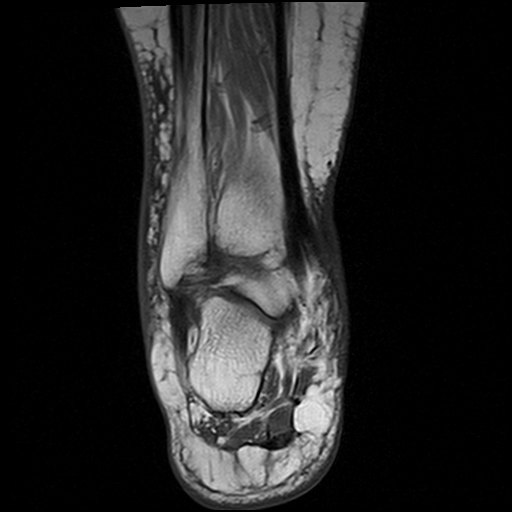
[im 23/23]
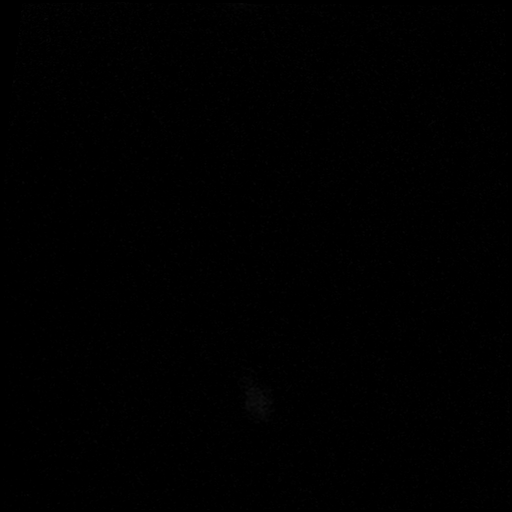

[23 of 40 positions shown; findings below may reference images not displayed]

FINDINGS: No evidence of acute fracture or bone bruise is seen at the right ankle.  Distal tibiofibular syndesmosis is intact with no evidence of widening of ankle mortise. Dome of the talus is smooth.  No fractures involving the calcaneus and navicular bone are noted.

The medial and lateral collateral ligaments are intact. Achilles tendon, peroneal tendons, medial tendons and extensor tendons of the ankle are intact.  Diffuse subcutaneous edema of distal lower leg and ankle is noted.

Examination of the foot shows fracture lines with bone marrow edema of the medial cuneiform bone in the midfoot.  The alignment at the Lisfranc joints including the 1st tarsometatarsal joint appears to be normal.  No acute bony lesions of the toes are seen.  No acute fractures of the metatarsals are seen.  Subcutaneous edema on the dorsal aspect of the foot is noted.
IMPRESSION: 1. Evidence of subacute fractures with bone marrow edema involving the medial cuneiform bone in the midfoot is noted.  The alignment of the bones at the Lisfranc joints appears to be normal. Degenerative arthritis of the Lisfranc joints is noted.

2. Diffuse subcutaneous edema on the dorsum of the foot and around the ankle and distal lower leg is noted.

3. No acute bony lesions at the ankle. Tendons and ligaments of the ankle appear to be intact.

## 2023-03-04 IMAGING — MR MRI FOOT RT WO CONTRAST
4 of 6 series · 19 of 40 positions shown · IV contrast (gadolinium)
Comparison: Report of outside x-ray dated 01/14/2023.

﻿EXAM:  [DATE]   MRI ANKLE RT WO CONTRAST,MRI FOOT RT WO CONTRAST
INDICATION: 72-year-old sustained trauma on [DATE]st, landing on her foot and ankle.  Pain predominantly in the posterior aspect of the foot and ankle.  No history of prior surgery.
TECHNIQUE: Multiplanar, multisequential MRI of the right ankle and right foot was performed without gadolinium contrast.

[Series 5: T1 · sagittal · 3.5mm · 0.51mm/px · 6 of 22 slices shown (1 of 3)]
[im 1/22]
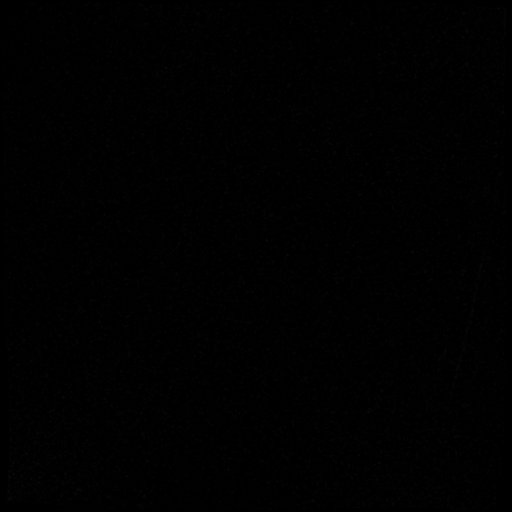
[im 5/22]
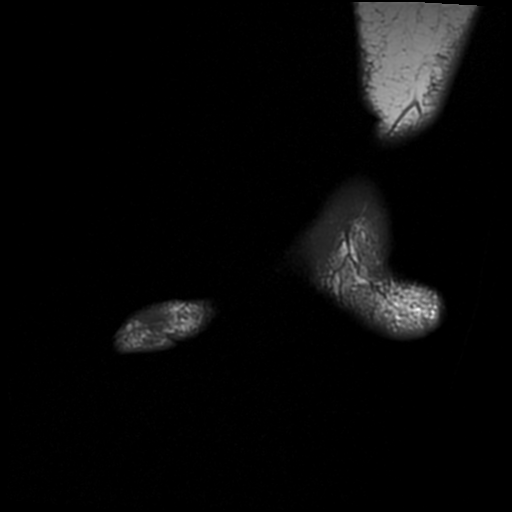
[im 9/22]
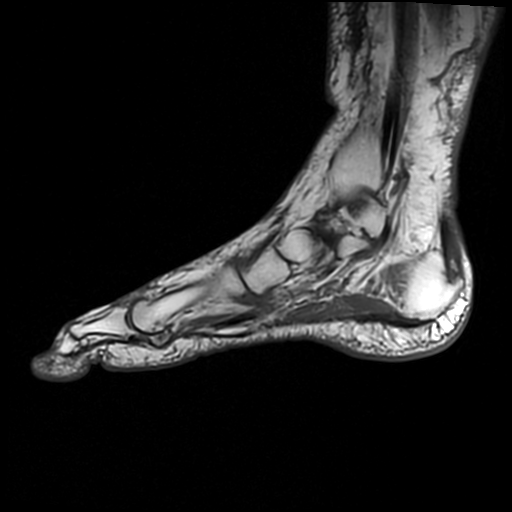
[im 13/22]
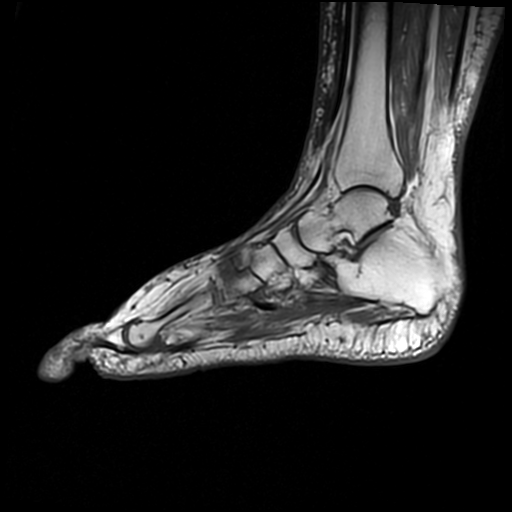
[im 17/22]
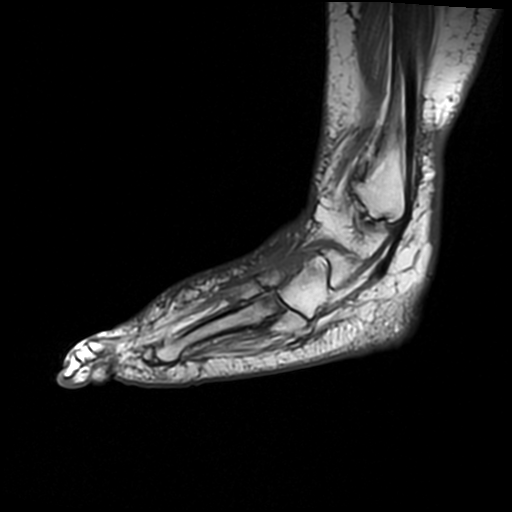
[im 22/22]
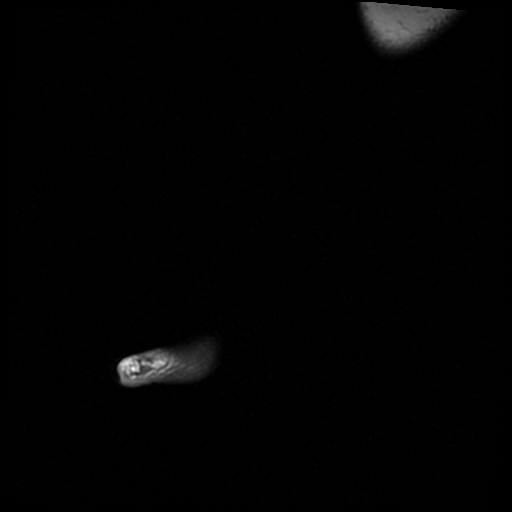

[Series 7: T1 · coronal · 6.0mm · 0.29mm/px · 4 of 32 slices shown (2 of 3)]
[im 1/32]
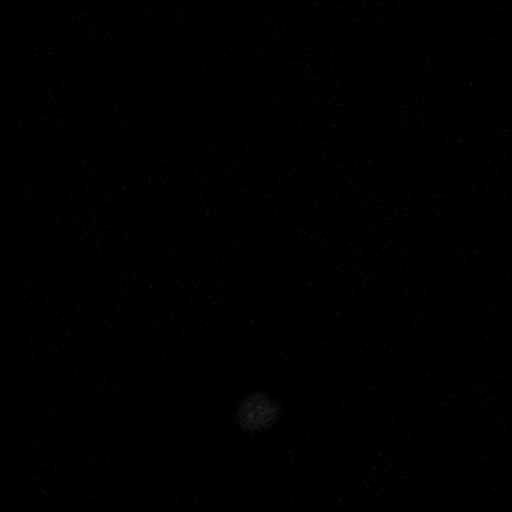
[im 5/32]
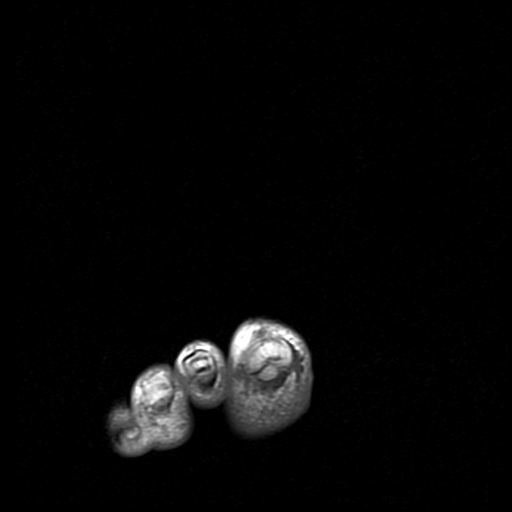
[im 18/32]
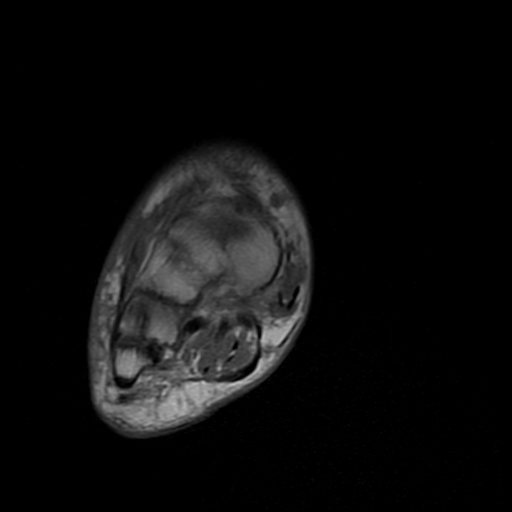
[im 27/32]
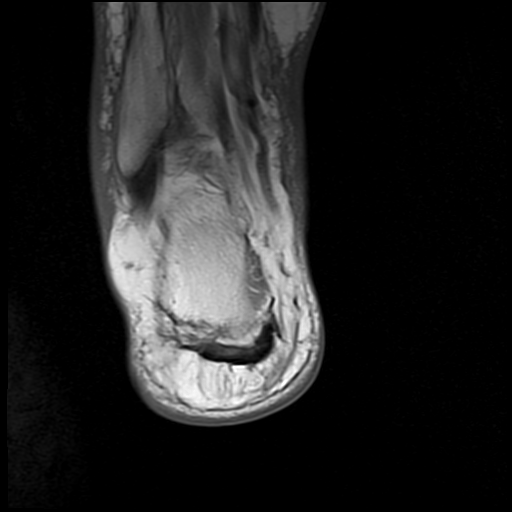

[Series 8: T1 · axial · 4.5mm · 0.53mm/px · z∈[-87,+3]mm · 3 of 22 slices shown (3 of 3)]
[im 5/22]
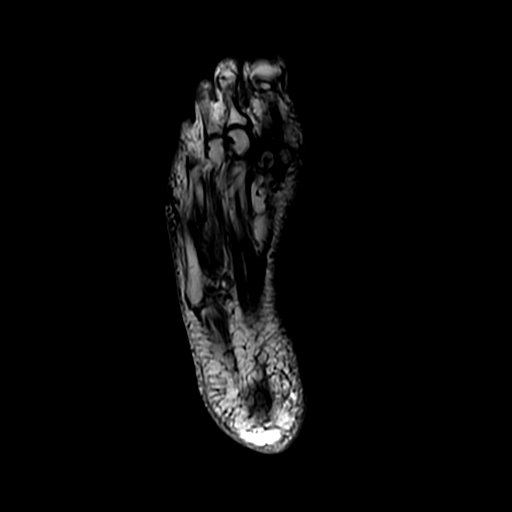
[im 13/22]
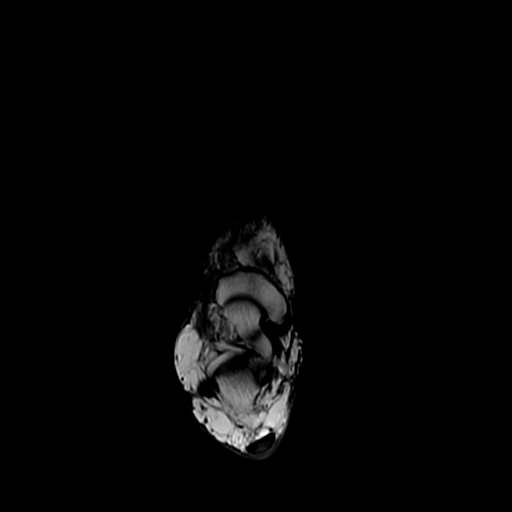
[im 22/22]
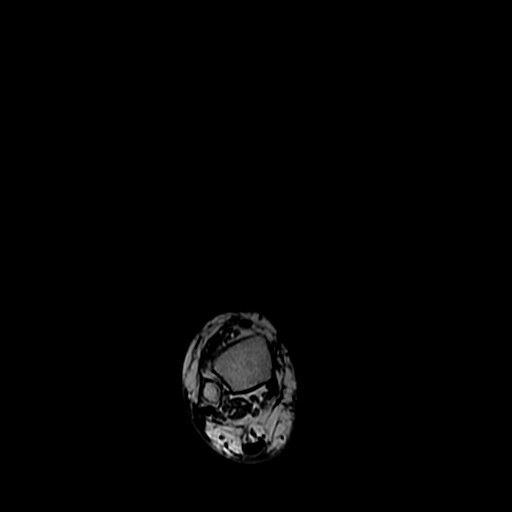

[Series 9: T2 fat-sat · axial · 4.5mm · 0.53mm/px · z∈[-108,+3]mm · 6 of 22 slices shown]
[im 1/22]
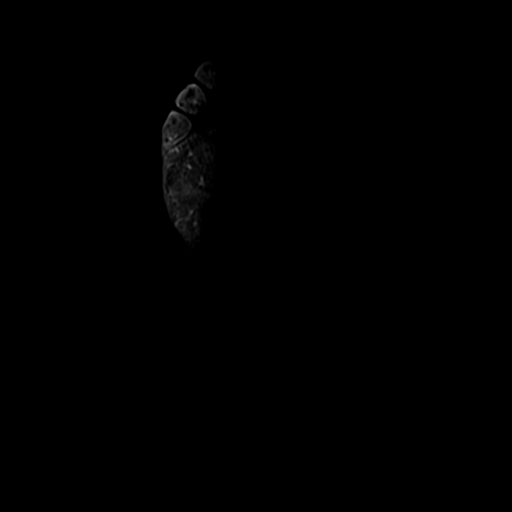
[im 5/22]
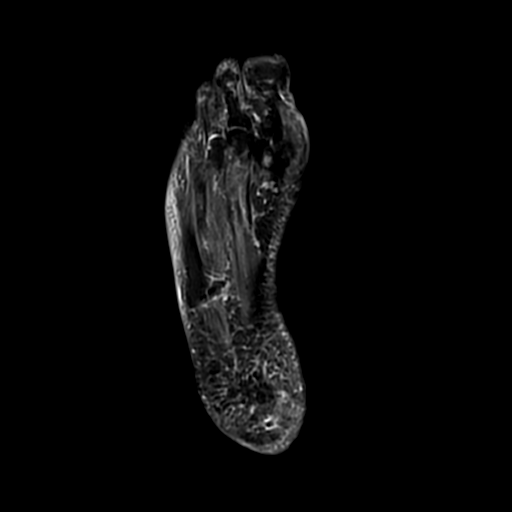
[im 9/22]
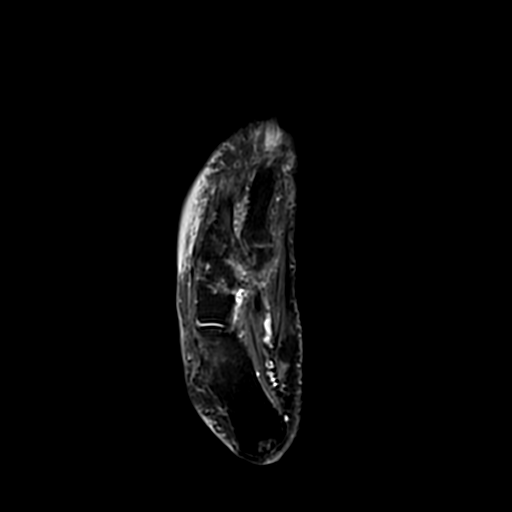
[im 13/22]
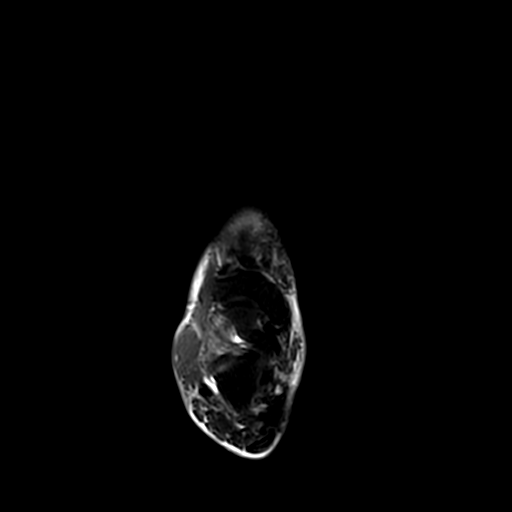
[im 17/22]
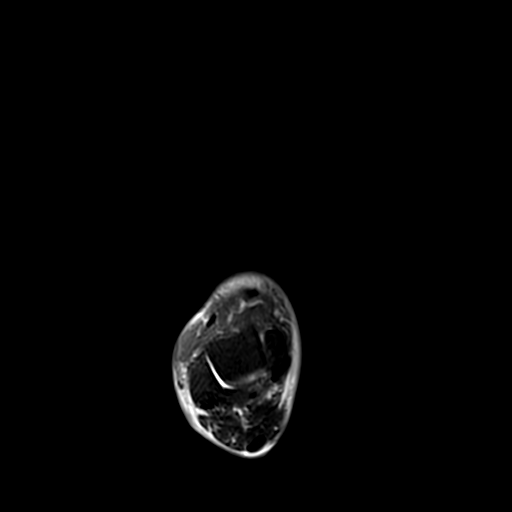
[im 22/22]
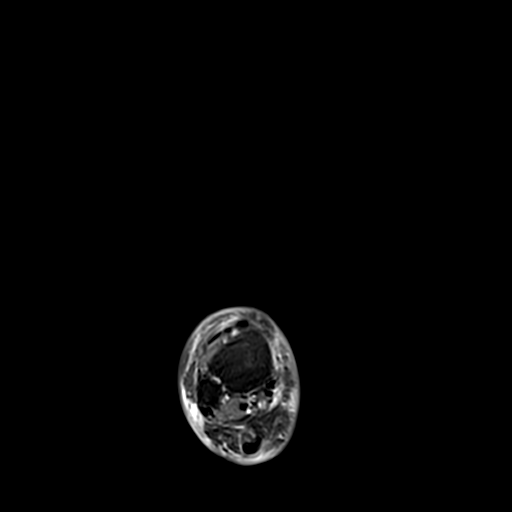

[19 of 40 positions shown; findings below may reference images not displayed]

FINDINGS: No evidence of acute fracture or bone bruise is seen at the right ankle.  Distal tibiofibular syndesmosis is intact with no evidence of widening of ankle mortise. Dome of the talus is smooth.  No fractures involving the calcaneus and navicular bone are noted.

The medial and lateral collateral ligaments are intact. Achilles tendon, peroneal tendons, medial tendons and extensor tendons of the ankle are intact.  Diffuse subcutaneous edema of distal lower leg and ankle is noted.

Examination of the foot shows fracture lines with bone marrow edema of the medial cuneiform bone in the midfoot.  The alignment at the Lisfranc joints including the 1st tarsometatarsal joint appears to be normal.  No acute bony lesions of the toes are seen.  No acute fractures of the metatarsals are seen.  Subcutaneous edema on the dorsal aspect of the foot is noted.
IMPRESSION: 1. Evidence of subacute fractures with bone marrow edema involving the medial cuneiform bone in the midfoot is noted.  The alignment of the bones at the Lisfranc joints appears to be normal. Degenerative arthritis of the Lisfranc joints is noted.

2. Diffuse subcutaneous edema on the dorsum of the foot and around the ankle and distal lower leg is noted.

3. No acute bony lesions at the ankle. Tendons and ligaments of the ankle appear to be intact.

## 2023-03-22 ENCOUNTER — Other Ambulatory Visit: Payer: Self-pay

## 2023-03-22 DIAGNOSIS — N390 Urinary tract infection, site not specified: Secondary | ICD-10-CM

## 2023-03-23 ENCOUNTER — Emergency Department
Admission: EM | Admit: 2023-03-23 | Discharge: 2023-03-23 | Disposition: A | Discharge status: Home / Self Care | Payer: Medicare HMO | Attending: Physician Assistant | Admitting: Physician Assistant

## 2023-03-23 ENCOUNTER — Encounter (HOSPITAL_COMMUNITY): Payer: Self-pay | Admitting: Physician Assistant

## 2023-03-23 DIAGNOSIS — N390 Urinary tract infection, site not specified: Secondary | ICD-10-CM | POA: Insufficient documentation

## 2023-03-23 DIAGNOSIS — M329 Systemic lupus erythematosus, unspecified: Secondary | ICD-10-CM | POA: Insufficient documentation

## 2023-03-23 LAB — COMPREHENSIVE METABOLIC PANEL, NON-FASTING
ALBUMIN/GLOBULIN RATIO: 1.5 — ABNORMAL HIGH (ref 0.8–1.4)
ALBUMIN: 4.4 g/dL (ref 3.5–5.7)
ALKALINE PHOSPHATASE: 64 U/L (ref 34–104)
ALT (SGPT): 16 U/L (ref 7–52)
ANION GAP: 11 mmol/L (ref 4–13)
AST (SGOT): 17 U/L (ref 13–39)
BILIRUBIN TOTAL: 0.5 mg/dL (ref 0.3–1.0)
BUN/CREA RATIO: 18 (ref 6–22)
BUN: 23 mg/dL (ref 7–25)
CALCIUM, CORRECTED: 9.1 mg/dL (ref 8.9–10.8)
CALCIUM: 9.4 mg/dL (ref 8.6–10.3)
CHLORIDE: 107 mmol/L (ref 98–107)
CO2 TOTAL: 20 mmol/L — ABNORMAL LOW (ref 21–31)
CREATININE: 1.25 mg/dL (ref 0.60–1.30)
ESTIMATED GFR: 46 mL/min/{1.73_m2} — ABNORMAL LOW (ref >59)
GLOBULIN: 2.9 (ref 2.0–3.5)
GLUCOSE: 121 mg/dL — ABNORMAL HIGH (ref 74–109)
OSMOLALITY, CALCULATED: 281 mosm/kg (ref 270–290)
POTASSIUM: 3.9 mmol/L (ref 3.5–5.1)
PROTEIN TOTAL: 7.3 g/dL (ref 6.4–8.9)
SODIUM: 138 mmol/L (ref 136–145)

## 2023-03-23 LAB — URINALYSIS, MACROSCOPIC
BILIRUBIN: NEGATIVE mg/dL
BILIRUBIN: NEGATIVE mg/dL
BLOOD: 1 mg/dL — AB
BLOOD: 1 mg/dL — AB
GLUCOSE: NEGATIVE mg/dL
GLUCOSE: NEGATIVE mg/dL
KETONES: NEGATIVE mg/dL
KETONES: NEGATIVE mg/dL
LEUKOCYTES: 500 WBCs/uL — AB
LEUKOCYTES: 500 WBCs/uL — AB
PH: 5.5 (ref 5.0–9.0)
PH: 6 (ref 5.0–9.0)
PROTEIN: 200 mg/dL — AB
PROTEIN: 300 mg/dL — AB
SPECIFIC GRAVITY: 1.016 (ref 1.002–1.030)
SPECIFIC GRAVITY: 1.017 (ref 1.002–1.030)
UROBILINOGEN: NORMAL mg/dL
UROBILINOGEN: NORMAL mg/dL

## 2023-03-23 LAB — CBC WITH DIFF
BASOPHIL #: 0.1 10*3/uL (ref 0.00–0.10)
BASOPHIL %: 1 % (ref 0–1)
EOSINOPHIL #: 0.6 10*3/uL — ABNORMAL HIGH (ref 0.00–0.50)
EOSINOPHIL %: 8 % — ABNORMAL HIGH (ref 1–7)
HCT: 35.9 % (ref 31.2–41.9)
HGB: 12.4 g/dL (ref 10.9–14.3)
LYMPHOCYTE #: 3 10*3/uL (ref 1.00–3.00)
LYMPHOCYTE %: 41 % (ref 16–44)
MCH: 33.3 pg — ABNORMAL HIGH (ref 24.7–32.8)
MCHC: 34.5 g/dL (ref 32.3–35.6)
MCV: 96.5 fL — ABNORMAL HIGH (ref 75.5–95.3)
MONOCYTE #: 0.6 10*3/uL (ref 0.30–1.00)
MONOCYTE %: 8 % (ref 5–13)
MPV: 7.6 fL — ABNORMAL LOW (ref 7.9–10.8)
NEUTROPHIL #: 3.2 10*3/uL (ref 1.85–7.80)
NEUTROPHIL %: 43 % (ref 43–77)
PLATELETS: 189 10*3/uL (ref 140–440)
RBC: 3.72 10*6/uL (ref 3.63–4.92)
RDW: 14.6 % (ref 12.3–17.7)
WBC: 7.5 10*3/uL (ref 3.8–11.8)

## 2023-03-23 LAB — URINALYSIS, MICROSCOPIC
NON-SQUAMOUS EPITHELIAL CELLS URINE: 2 /[HPF] — ABNORMAL HIGH (ref <=1)
RBCS: 249 /[HPF] — ABNORMAL HIGH (ref <4)
RBCS: 92 /[HPF] — ABNORMAL HIGH (ref <4)
SQUAMOUS EPITHELIAL: 1 /[HPF] (ref <28)
SQUAMOUS EPITHELIAL: 9 /[HPF] (ref <28)
WBCS: 1010 /[HPF] — ABNORMAL HIGH (ref <6)
WBCS: 1260 /[HPF] — ABNORMAL HIGH (ref <6)

## 2023-03-23 LAB — LACTIC ACID LEVEL W/ REFLEX FOR LEVEL >2.0: LACTIC ACID: 0.7 mmol/L (ref 0.5–2.2)

## 2023-03-23 MED ORDER — CEFDINIR 300 MG CAPSULE
300.0000 mg | ORAL_CAPSULE | Freq: Two times a day (BID) | ORAL | 0 refills | Status: AC
Start: 2023-03-23 — End: 2023-04-02

## 2023-03-23 MED ORDER — PHENAZOPYRIDINE 100 MG TABLET
100.0000 mg | ORAL_TABLET | ORAL | Status: AC
Start: 2023-03-23 — End: 2023-03-23
  Administered 2023-03-23: 100 mg via ORAL

## 2023-03-23 MED ORDER — LIDOCAINE HCL 10 MG/ML (1 %) INJECTION SOLUTION
1.0000 g | INTRAMUSCULAR | Status: AC
Start: 2023-03-23 — End: 2023-03-23
  Administered 2023-03-23: 1 g via INTRAMUSCULAR

## 2023-03-23 MED ORDER — CEFTRIAXONE 1 GRAM SOLUTION FOR INJECTION
INTRAMUSCULAR | Status: AC
Start: 2023-03-23 — End: 2023-03-23
  Filled 2023-03-23: qty 10

## 2023-03-23 MED ORDER — PHENAZOPYRIDINE 100 MG TABLET
ORAL_TABLET | ORAL | Status: AC
Start: 2023-03-23 — End: 2023-03-23
  Filled 2023-03-23: qty 1

## 2023-03-23 NOTE — ED Triage Notes (Signed)
Pain on urination with some bilateral lower back. Started 4 days ago.

## 2023-03-23 NOTE — ED Provider Notes (Signed)
[facility] - Emergency Department  ED Primary Provider Note  History of Present Illness   Chief Complaint   Patient presents with   . Urinary Pain     Judy Hicks is a 72 y.o. female who had concerns including Urinary Pain.  Arrival: The patient arrived by Car    72 year old female past medical history of hypertension coronary artery disease lupus presents emergency department with complaints of dysuria.  States it has been ongoing for 4 days became worse today describes it as sharp in the lower abdomen denies back pain fevers chills she has had some nausea today.      History Reviewed This Encounter: Medical History  Surgical History  Family History  Social History    Physical Exam   ED Triage Vitals [03/23/23 0005]   BP (Non-Invasive) (!) 150/86   Heart Rate 78   Respiratory Rate (!) 21   Temperature 36.6 C (97.9 F)   SpO2 99 %   Weight    Height      Physical Exam  Constitutional:       Appearance: Normal appearance.   HENT:      Head: Normocephalic.      Nose: Nose normal.      Mouth/Throat:      Mouth: Mucous membranes are moist.   Eyes:      Extraocular Movements: Extraocular movements intact.      Pupils: Pupils are equal, round, and reactive to light.   Cardiovascular:      Rate and Rhythm: Normal rate and regular rhythm.      Pulses: Normal pulses.      Heart sounds: Normal heart sounds.   Pulmonary:      Effort: Pulmonary effort is normal.      Breath sounds: Normal breath sounds.   Abdominal:      General: Abdomen is flat. Bowel sounds are normal. There is no distension.      Palpations: Abdomen is soft.      Comments: Bedside ultrasound negative for hydronephrosis bilaterally   Musculoskeletal:      Cervical back: Normal range of motion.   Neurological:      Mental Status: She is alert.       Patient Data     Labs Ordered/Reviewed   COMPREHENSIVE METABOLIC PANEL, NON-FASTING - Abnormal; Notable for the following components:       Result Value    CO2 TOTAL 20 (*)     ESTIMATED  GFR 46 (*)     GLUCOSE 121 (*)     ALBUMIN/GLOBULIN RATIO 1.5 (*)     All other components within normal limits    Narrative:     Estimated Glomerular Filtration Rate (eGFR) is calculated using the CKD-EPI (2021) equation, intended for patients 84 years of age and older. If gender is not documented or "unknown", there will be no eGFR calculation.     CBC WITH DIFF - Abnormal; Notable for the following components:    MCV 96.5 (*)     MCH 33.3 (*)     MPV 7.6 (*)     EOSINOPHIL % 8 (*)     EOSINOPHIL # 0.60 (*)     All other components within normal limits   URINALYSIS, MACROSCOPIC - Abnormal; Notable for the following components:    COLOR Orange (*)     APPEARANCE Ex. Turbid (*)     LEUKOCYTES 500 (*)     NITRITE 1+ (*)  PROTEIN 300 (*)     BLOOD 1.0 (*)     All other components within normal limits    Narrative:     Substances that cause abnormal urine color may affect the readability of the urinalysis reagent strips. The color development on the reagant pad may be masked and could be interpreted as a false positive.     URINALYSIS, MICROSCOPIC - Abnormal; Notable for the following components:    RBCS 249 (*)     WBCS 1,260 (*)     WHITE BLOOD CELL CLUMP Many (*)     NON-SQUAMOUS EPITHELIAL CELLS URINE 2 (*)     All other components within normal limits   URINALYSIS, MACROSCOPIC - Abnormal; Notable for the following components:    COLOR Orange (*)     APPEARANCE Ex. Turbid (*)     LEUKOCYTES 500 (*)     NITRITE 1+ (*)     PROTEIN 200 (*)     BLOOD 1.0 (*)     All other components within normal limits    Narrative:     Substances that cause abnormal urine color may affect the readability of the urinalysis reagent strips. The color development on the reagant pad may be masked and could be interpreted as a false positive.     URINALYSIS, MICROSCOPIC - Abnormal; Notable for the following components:    RBCS 92 (*)     WBCS 1,010 (*)     WHITE BLOOD CELL CLUMP Many (*)     All other components within normal limits    URINE CULTURE,ROUTINE   URINE CULTURE,ROUTINE   CBC/DIFF    Narrative:     The following orders were created for panel order CBC/DIFF.  Procedure                               Abnormality         Status                     ---------                               -----------         ------                     CBC WITH ZOXW[[id number]]                Abnormal            Final result                 Please view results for these tests on the individual orders.   URINALYSIS, MACROSCOPIC AND MICROSCOPIC W/CULTURE REFLEX    Narrative:     The following orders were created for panel order URINALYSIS, MACROSCOPIC AND MICROSCOPIC W/CULTURE REFLEX.  Procedure                               Abnormality         Status                     ---------                               -----------         ------  URINALYSIS, MACROSCOPIC[[id number]]      Abnormal            Final result               URINALYSIS, MICROSCOPIC[[id number]]      Abnormal            Final result                 Please view results for these tests on the individual orders.   URINALYSIS, MACROSCOPIC AND MICROSCOPIC W/CULTURE REFLEX    Narrative:     The following orders were created for panel order URINALYSIS, MACROSCOPIC AND MICROSCOPIC W/CULTURE REFLEX [PRN ONLY].  Procedure                               Abnormality         Status                     ---------                               -----------         ------                     URINALYSIS, MACROSCOPIC[[id number]]      Abnormal            Final result               URINALYSIS, MICROSCOPIC[[id number]]      Abnormal            Final result                 Please view results for these tests on the individual orders.   LACTIC ACID LEVEL W/ REFLEX FOR LEVEL >2.0     No orders to display     Medical Decision Making        Medical Decision Making  CBC metabolic panel unremarkable urinalysis demonstrates significant urinary tract infection.  Patient was given Pyridium here in the emergency  department.  She has penicillin listed as an allergy causing shortness of breath however she states she has had cephalexin in the past without issue.  She is given Rocephin IM here in the ED and written a prescription for cefdinir.  We discussed the importance of primary care follow-up and return precautions    Risk  Prescription drug management.      ED Course as of 03/23/23 0109   Wynelle Link Mar 23, 2023   0108 CREATININE: 1.25            Medications Administered in the ED   phenazopyridine (PYRIDIUM) tablet (100 mg Oral Given 03/23/23 0027)     Clinical Impression   UTI (urinary tract infection) (Primary)       Disposition: Discharged

## 2023-03-23 NOTE — ED Nurses Note (Signed)
Patient discharged at this time. Patient given a copy of discharge paperwork to review. Patient verbalizes understanding of discharge instructions and all questions answered to satisfaction. Patient out of ER at this time via ambulation.

## 2023-03-23 NOTE — Discharge Instructions (Signed)
Push fluids take rest.  You can take the azo to help with the symptoms.  A prescription for antibiotics was written take this as prescribed.  Follow up with the primary care to make sure getting better.  If new or worsening symptoms return to the emergency department

## 2023-03-25 LAB — URINE CULTURE,ROUTINE
URINE CULTURE: >100000 — AB
URINE CULTURE: >100000 — AB

## 2023-06-27 ENCOUNTER — Other Ambulatory Visit (HOSPITAL_COMMUNITY): Payer: Self-pay

## 2023-06-27 DIAGNOSIS — Z0000 Encounter for general adult medical examination without abnormal findings: Secondary | ICD-10-CM

## 2023-07-11 ENCOUNTER — Other Ambulatory Visit (HOSPITAL_COMMUNITY): Payer: Self-pay

## 2023-07-11 DIAGNOSIS — R221 Localized swelling, mass and lump, neck: Secondary | ICD-10-CM

## 2023-07-17 ENCOUNTER — Other Ambulatory Visit: Payer: Self-pay

## 2023-07-17 ENCOUNTER — Ambulatory Visit (HOSPITAL_COMMUNITY)
Admission: RE | Admit: 2023-07-17 | Discharge: 2023-07-17 | Disposition: A | Discharge status: Home / Self Care | Payer: Medicare HMO | Source: Ambulatory Visit

## 2023-07-17 ENCOUNTER — Ambulatory Visit
Admission: RE | Admit: 2023-07-17 | Discharge: 2023-07-17 | Disposition: A | Discharge status: Home / Self Care | Payer: Medicare HMO | Source: Ambulatory Visit

## 2023-07-17 ENCOUNTER — Encounter (HOSPITAL_COMMUNITY): Payer: Self-pay

## 2023-07-17 DIAGNOSIS — Z0000 Encounter for general adult medical examination without abnormal findings: Secondary | ICD-10-CM

## 2023-07-17 DIAGNOSIS — Z780 Asymptomatic menopausal state: Secondary | ICD-10-CM | POA: Insufficient documentation

## 2023-07-17 DIAGNOSIS — Z1231 Encounter for screening mammogram for malignant neoplasm of breast: Secondary | ICD-10-CM | POA: Insufficient documentation

## 2023-07-18 ENCOUNTER — Other Ambulatory Visit (HOSPITAL_COMMUNITY): Payer: Self-pay

## 2023-07-18 DIAGNOSIS — Z1231 Encounter for screening mammogram for malignant neoplasm of breast: Secondary | ICD-10-CM

## 2023-08-10 ENCOUNTER — Ambulatory Visit

## 2023-08-18 ENCOUNTER — Encounter (INDEPENDENT_AMBULATORY_CARE_PROVIDER_SITE_OTHER): Payer: Self-pay

## 2023-08-21 ENCOUNTER — Ambulatory Visit

## 2023-08-21 ENCOUNTER — Other Ambulatory Visit: Payer: Self-pay

## 2023-08-21 DIAGNOSIS — S20469A Insect bite (nonvenomous) of unspecified back wall of thorax, initial encounter: Secondary | ICD-10-CM | POA: Insufficient documentation

## 2023-08-21 DIAGNOSIS — W57XXXA Bitten or stung by nonvenomous insect and other nonvenomous arthropods, initial encounter: Secondary | ICD-10-CM | POA: Insufficient documentation

## 2023-08-23 LAB — BORRELIA SPECIES DNA REAL TIME PCR, QUAL, MISC: LYME DIS (B.SPP) DNA,QL,B: NOT DETECTED

## 2023-10-02 ENCOUNTER — Ambulatory Visit

## 2023-10-02 ENCOUNTER — Other Ambulatory Visit: Payer: Self-pay

## 2023-10-02 DIAGNOSIS — R202 Paresthesia of skin: Secondary | ICD-10-CM

## 2023-10-02 DIAGNOSIS — H04129 Dry eye syndrome of unspecified lacrimal gland: Secondary | ICD-10-CM | POA: Insufficient documentation

## 2023-10-02 DIAGNOSIS — R2689 Other abnormalities of gait and mobility: Secondary | ICD-10-CM | POA: Insufficient documentation

## 2023-10-02 DIAGNOSIS — R519 Headache, unspecified: Secondary | ICD-10-CM | POA: Insufficient documentation

## 2023-10-02 LAB — COMPREHENSIVE METABOLIC PANEL, NON-FASTING
ALBUMIN/GLOBULIN RATIO: 1.3 (ref 0.8–1.4)
ALBUMIN: 4 g/dL (ref 3.5–5.7)
ALKALINE PHOSPHATASE: 64 U/L (ref 34–104)
ALT (SGPT): 19 U/L (ref 7–52)
ANION GAP: 8 mmol/L (ref 4–13)
AST (SGOT): 19 U/L (ref 13–39)
BILIRUBIN TOTAL: 0.4 mg/dL (ref 0.3–1.0)
BUN/CREA RATIO: 15 (ref 6–22)
BUN: 14 mg/dL (ref 7–25)
CALCIUM, CORRECTED: 9 mg/dL (ref 8.9–10.8)
CALCIUM: 9 mg/dL (ref 8.6–10.3)
CHLORIDE: 106 mmol/L (ref 98–107)
CO2 TOTAL: 27 mmol/L (ref 21–31)
CREATININE: 0.96 mg/dL (ref 0.60–1.30)
ESTIMATED GFR: 63 mL/min/{1.73_m2} (ref >59)
GLOBULIN: 3 (ref 2.0–3.5)
GLUCOSE: 128 mg/dL — ABNORMAL HIGH (ref 74–109)
OSMOLALITY, CALCULATED: 283 mosm/kg (ref 270–290)
POTASSIUM: 3.2 mmol/L — ABNORMAL LOW (ref 3.5–5.1)
PROTEIN TOTAL: 7 g/dL (ref 6.4–8.9)
SODIUM: 141 mmol/L (ref 136–145)

## 2023-10-02 LAB — CBC
HCT: 38.9 % (ref 31.2–41.9)
HGB: 13.7 g/dL (ref 10.9–14.3)
MCH: 32.6 pg (ref 24.7–32.8)
MCHC: 35.2 g/dL (ref 32.3–35.6)
MCV: 92.7 fL (ref 75.5–95.3)
MPV: 7.5 fL — ABNORMAL LOW (ref 7.9–10.8)
PLATELETS: 231 10*3/uL (ref 140–440)
RBC: 4.2 10*6/uL (ref 3.63–4.92)
RDW: 13.8 % (ref 12.3–17.7)
WBC: 4.7 10*3/uL (ref 3.8–11.8)

## 2023-10-07 LAB — ANA REFLEX: ANA FINAL INTERPRETATION: NEGATIVE

## 2023-12-07 ENCOUNTER — Emergency Department (HOSPITAL_COMMUNITY)

## 2023-12-07 ENCOUNTER — Other Ambulatory Visit: Payer: Self-pay

## 2023-12-07 ENCOUNTER — Emergency Department: Admission: EM | Admit: 2023-12-07 | Discharge: 2023-12-08 | Disposition: A | Discharge status: Home / Self Care

## 2023-12-07 DIAGNOSIS — W57XXXA Bitten or stung by nonvenomous insect and other nonvenomous arthropods, initial encounter: Secondary | ICD-10-CM | POA: Insufficient documentation

## 2023-12-07 DIAGNOSIS — E669 Obesity, unspecified: Secondary | ICD-10-CM | POA: Insufficient documentation

## 2023-12-07 DIAGNOSIS — L03115 Cellulitis of right lower limb: Secondary | ICD-10-CM | POA: Insufficient documentation

## 2023-12-07 DIAGNOSIS — S80861A Insect bite (nonvenomous), right lower leg, initial encounter: Secondary | ICD-10-CM | POA: Insufficient documentation

## 2023-12-07 DIAGNOSIS — L299 Pruritus, unspecified: Secondary | ICD-10-CM | POA: Insufficient documentation

## 2023-12-07 DIAGNOSIS — R0789 Other chest pain: Secondary | ICD-10-CM | POA: Insufficient documentation

## 2023-12-07 DIAGNOSIS — R079 Chest pain, unspecified: Secondary | ICD-10-CM

## 2023-12-07 LAB — CBC WITH DIFF
BASOPHIL #: 0 x10ˆ3/uL (ref 0.00–0.10)
BASOPHIL %: 1 % (ref 0–1)
EOSINOPHIL #: 0.3 x10ˆ3/uL (ref 0.00–0.50)
EOSINOPHIL %: 5 % (ref 1–7)
HCT: 37.1 % (ref 31.2–41.9)
HGB: 12.9 g/dL (ref 10.9–14.3)
LYMPHOCYTE #: 2.2 x10ˆ3/uL (ref 1.10–3.10)
LYMPHOCYTE %: 40 % (ref 16–46)
MCH: 32.9 pg — ABNORMAL HIGH (ref 24.7–32.8)
MCHC: 34.8 g/dL (ref 32.3–35.6)
MCV: 94.3 fL (ref 75.5–95.3)
MONOCYTE #: 0.4 x10ˆ3/uL (ref 0.20–0.90)
MONOCYTE %: 7 % (ref 4–11)
MPV: 7.2 fL — ABNORMAL LOW (ref 7.9–10.8)
NEUTROPHIL #: 2.7 x10ˆ3/uL (ref 1.90–8.20)
NEUTROPHIL %: 47 % (ref 43–77)
PLATELETS: 191 x10ˆ3/uL (ref 140–440)
RBC: 3.93 x10ˆ6/uL (ref 3.63–4.92)
RDW: 14.5 % (ref 12.3–17.7)
WBC: 5.6 x10ˆ3/uL (ref 3.8–11.8)

## 2023-12-07 LAB — COMPREHENSIVE METABOLIC PANEL, NON-FASTING
ALBUMIN/GLOBULIN RATIO: 1.5 — ABNORMAL HIGH (ref 0.8–1.4)
ALBUMIN: 4.3 g/dL (ref 3.5–5.7)
ALKALINE PHOSPHATASE: 65 U/L (ref 34–104)
ALT (SGPT): 17 U/L (ref 7–52)
ANION GAP: 11 mmol/L (ref 4–13)
AST (SGOT): 20 U/L (ref 13–39)
BILIRUBIN TOTAL: 0.4 mg/dL (ref 0.3–1.0)
BUN/CREA RATIO: 19 (ref 6–22)
BUN: 23 mg/dL (ref 7–25)
CALCIUM, CORRECTED: 9 mg/dL (ref 8.9–10.8)
CALCIUM: 9.2 mg/dL (ref 8.6–10.3)
CHLORIDE: 106 mmol/L (ref 98–107)
CO2 TOTAL: 21 mmol/L (ref 21–31)
CREATININE: 1.24 mg/dL (ref 0.60–1.30)
ESTIMATED GFR: 46 mL/min/1.73mˆ2 — ABNORMAL LOW (ref >59)
GLOBULIN: 2.8 (ref 2.0–3.5)
GLUCOSE: 112 mg/dL — ABNORMAL HIGH (ref 74–109)
OSMOLALITY, CALCULATED: 280 mosm/kg (ref 270–290)
POTASSIUM: 3.6 mmol/L (ref 3.5–5.1)
PROTEIN TOTAL: 7.1 g/dL (ref 6.4–8.9)
SODIUM: 138 mmol/L (ref 136–145)

## 2023-12-07 LAB — PT/INR
INR: 0.99 (ref 0.84–1.10)
PROTHROMBIN TIME: 11.2 s (ref 9.8–12.7)

## 2023-12-07 LAB — PTT (PARTIAL THROMBOPLASTIN TIME): APTT: 30.6 s (ref 25.0–38.0)

## 2023-12-07 LAB — TROPONIN-I
TROPONIN I: 4 ng/L (ref <15)
TROPONIN I: 4 ng/L (ref <15)

## 2023-12-07 MED ORDER — DEXAMETHASONE SODIUM PHOSPHATE (PF) 10 MG/ML INJECTION SOLUTION
10.0000 mg | INTRAMUSCULAR | Status: DC
Start: 2023-12-07 — End: 2023-12-08

## 2023-12-07 MED ORDER — DOXYCYCLINE HYCLATE 100 MG CAPSULE
100.0000 mg | ORAL_CAPSULE | Freq: Two times a day (BID) | ORAL | 0 refills | Status: AC
Start: 2023-12-07 — End: 2023-12-14

## 2023-12-07 MED ORDER — FAMOTIDINE (PF) 20 MG/2 ML INTRAVENOUS SOLUTION
20.0000 mg | INTRAVENOUS | Status: DC
Start: 2023-12-07 — End: 2023-12-08

## 2023-12-07 MED ORDER — ASPIRIN 81 MG CHEWABLE TABLET
324.0000 mg | CHEWABLE_TABLET | ORAL | Status: DC
Start: 2023-12-07 — End: 2023-12-08

## 2023-12-07 MED ORDER — DIPHENHYDRAMINE 50 MG/ML INJECTION SOLUTION
12.5000 mg | INTRAMUSCULAR | Status: DC
Start: 2023-12-07 — End: 2023-12-08

## 2023-12-07 NOTE — ED Provider Notes (Signed)
 [facility] - Emergency Department  ED Primary Note  History of Present Illness   Judy Hicks is a 73 y.o. female who had concerns including Chest Pain  and Rash. ***    Physical Exam   ED Triage Vitals [12/07/23 2106]   BP (Non-Invasive) 128/76   Heart Rate 65   Respiratory Rate 20   Temperature (!) 35.9 C (96.7 F)   SpO2 98 %   Weight 96.2 kg (212 lb)   Height 1.651 m (5' 5)     {ED Physical Exam:54666}  Patient Data   Labs Ordered/Reviewed - No data to display  No orders to display     Medical Decision Making   {?? Select the MDM button at the top of the note composer window and be sure to fill out the MDM SmartBlock in Notewriter to the left:38154}ED clinical impression missing, please click on the following link to add Clinical Impressions ***  then refresh the note prior to signing.    Medical Decision Making              Medications Ordered/Administered in the ED   aspirin  chewable tablet 324 mg (has no administration in time range)   famotidine  (PEPCID ) 10 mg/mL injection (has no administration in time range)   diphenhydrAMINE  (BENADRYL ) 50 mg/mL injection (has no administration in time range)   dexAMETHasone  (PF) 10 mg/mL injection (has no administration in time range)     ED clinical impression missing, please click on the following link to add Clinical Impressions ***  then refresh the note prior to signing.    Disposition: Data Unavailable  {?Quick Link  Level of Service Calculator:123}  {Critical Care Time (Optional):37527}     {?? Remember to refresh your note prior to signing. Use Control + F11 or click the refresh button at the bottom of the note:38154}

## 2023-12-07 NOTE — ED Triage Notes (Signed)
 Rash all over, itching, chest pain x2 days.

## 2023-12-08 LAB — ECG 12 LEAD
Atrial Rate: 68 {beats}/min
Calculated P Axis: 18 degrees
Calculated R Axis: -1 degrees
Calculated T Axis: 8 degrees
PR Interval: 198 ms
QRS Duration: 78 ms
QT Interval: 436 ms
QTC Calculation: 463 ms
Ventricular rate: 68 {beats}/min

## 2023-12-08 MED ORDER — DEXAMETHASONE SODIUM PHOSPHATE (PF) 10 MG/ML INJECTION SOLUTION
INTRAMUSCULAR | Status: AC
Start: 2023-12-08 — End: 2023-12-08
  Filled 2023-12-08: qty 1

## 2023-12-08 MED ORDER — DIPHENHYDRAMINE 25 MG CAPSULE
ORAL_CAPSULE | ORAL | Status: AC
Start: 2023-12-08 — End: 2023-12-08
  Filled 2023-12-08: qty 2

## 2023-12-08 MED ORDER — DEXAMETHASONE SODIUM PHOSPHATE (PF) 10 MG/ML INJECTION SOLUTION
10.0000 mg | INTRAMUSCULAR | Status: AC
Start: 2023-12-08 — End: 2023-12-08
  Administered 2023-12-08: 10 mg via INTRAMUSCULAR

## 2023-12-08 MED ORDER — DIPHENHYDRAMINE 25 MG CAPSULE
50.0000 mg | ORAL_CAPSULE | ORAL | Status: AC
Start: 2023-12-08 — End: 2023-12-08
  Administered 2023-12-08: 50 mg via ORAL

## 2023-12-08 NOTE — ED Nurses Note (Signed)
 Patient discharged from waiting room at this time after medication administration per provider order. Vital signs stable, respirations even and unlabored, no primary assessment completed at this time due to no room assignment. Patient discharged.

## 2023-12-18 ENCOUNTER — Other Ambulatory Visit (HOSPITAL_COMMUNITY): Payer: Self-pay

## 2023-12-18 DIAGNOSIS — R112 Nausea with vomiting, unspecified: Secondary | ICD-10-CM

## 2023-12-18 DIAGNOSIS — M549 Dorsalgia, unspecified: Secondary | ICD-10-CM

## 2024-01-14 ENCOUNTER — Emergency Department
Admission: EM | Admit: 2024-01-14 | Discharge: 2024-01-14 | Disposition: A | Discharge status: Home / Self Care | Source: Home / Self Care | Attending: Family Medicine | Admitting: Family Medicine

## 2024-01-14 ENCOUNTER — Other Ambulatory Visit: Payer: Self-pay

## 2024-01-14 ENCOUNTER — Encounter (HOSPITAL_COMMUNITY): Payer: Self-pay | Admitting: Family Medicine

## 2024-01-14 DIAGNOSIS — I10 Essential (primary) hypertension: Secondary | ICD-10-CM | POA: Insufficient documentation

## 2024-01-14 DIAGNOSIS — J069 Acute upper respiratory infection, unspecified: Secondary | ICD-10-CM

## 2024-01-14 DIAGNOSIS — B349 Viral infection, unspecified: Secondary | ICD-10-CM | POA: Insufficient documentation

## 2024-01-14 LAB — COVID-19, FLU A/B, RSV RAPID BY PCR
INFLUENZA VIRUS TYPE A: NOT DETECTED
INFLUENZA VIRUS TYPE B: NOT DETECTED
RESPIRATORY SYNCTIAL VIRUS (RSV): NOT DETECTED
SARS-CoV-2: NOT DETECTED

## 2024-01-14 MED ORDER — ACETAMINOPHEN 325 MG TABLET
650.0000 mg | ORAL_TABLET | ORAL | Status: AC
Start: 2024-01-14 — End: 2024-01-14
  Administered 2024-01-14: 650 mg via ORAL

## 2024-01-14 MED ORDER — DEXAMETHASONE SODIUM PHOSPHATE 4 MG/ML INJECTION SOLUTION
INTRAMUSCULAR | Status: AC
Start: 2024-01-14 — End: 2024-01-14
  Filled 2024-01-14: qty 1

## 2024-01-14 MED ORDER — ONDANSETRON 4 MG DISINTEGRATING TABLET
4.0000 mg | ORAL_TABLET | Freq: Three times a day (TID) | ORAL | 0 refills | Status: AC | PRN
Start: 2024-01-14 — End: ?

## 2024-01-14 MED ORDER — ACETAMINOPHEN 325 MG TABLET
ORAL_TABLET | ORAL | Status: AC
Start: 2024-01-14 — End: 2024-01-14
  Filled 2024-01-14: qty 2

## 2024-01-14 MED ORDER — DEXAMETHASONE SODIUM PHOSPHATE 4 MG/ML INJECTION SOLUTION
4.0000 mg | INTRAMUSCULAR | Status: AC
Start: 2024-01-14 — End: 2024-01-14
  Administered 2024-01-14: 4 mg via INTRAMUSCULAR

## 2024-01-14 NOTE — ED Triage Notes (Signed)
 Increase sob with headache x 2 days . Congestion

## 2024-01-14 NOTE — ED Provider Notes (Signed)
 [facility]  ED Primary Provider Note  Patient Name: Judy Hicks  Patient Age: 73 y.o.  Date of Birth: March 27, 1951    Chief Complaint: Shortness of Breath        History of Present Illness       Judy Hicks is a 73 y.o. female who had concerns including Shortness of Breath.   History of Present Illness  Judy Hicks is a 73 year old female who presents with respiratory symptoms and sore throat.    She began experiencing respiratory symptoms two days ago, including a dry cough and sore throat that developed today. She woke up last night choking on mucus or phlegm, which has worsened today. No nasal drainage or ear pain, but she mentions a recent large piece of wax coming out of her ear.    She experiences constant chills and body aches throughout her body. No fever as she lost her thermometer. She experienced nausea early this morning around 4 AM but has not vomited.    She describes chest pains that occur with coughing and breathing but do not last long. No burning during urination, diarrhea, or swelling in her legs. She wears a pad for incontinence but has not noticed any recent changes.    Her past medical history includes high blood pressure and a condition affecting her feet for the last four years, causing difficulty walking and shortness of breath. She has tolerated steroid shots well in the past and has used Zofran  for nausea.         Review of Systems     No other overt Review of Systems are noted to be positive except noted in the HPI.      Historical Data   History Reviewed This Encounter: Medical History  Surgical History  Family History  Social History      Physical Exam   ED Triage Vitals [01/14/24 1620]   BP (Non-Invasive) 123/88   Heart Rate 69   Respiratory Rate 20   Temperature 36.5 C (97.7 F)   SpO2 95 %   Weight 96.2 kg (212 lb)   Height 1.676 m (5' 6)         Nursing notes reviewed for what could be assessed. Past Medical, Surgical, and Social history  reviewed for what has been completed.     Constitutional: NAD. Well-Developed. Well Nourished.  Afebrile  Head: Normocephalic, atraumatic.  Mouth/Throat: no nasal discharge, posterior pharynx reveals erythema without evidence of exudate.  Airway was patent.  Eyes: EOM grossly intact, conjunctiva normal.  Ears: Ear canals clear bilaterally and TMs intact without evidence of otitis on examination.  Neck: Supple, without lymphadenopathy  Cardiovascular: Regular Rate and Rhythm, extremities well perfused.  Pulmonary/Chest: No respiratory distress. Lungs are symmetric to auscultation bilaterally.  Abdominal: Soft, non-tender, non-distended.  MSK: No Lower Extremity Edema.  Skin: Warm, dry, and intact  Neuro: Appropriate, CN II-XII grossly intact   Psych: Cooperative     Physical Exam           Procedures      Patient Data     Labs Ordered/Reviewed   COVID-19, FLU A/B, RSV RAPID BY PCR - Normal    Narrative:     Results are for the simultaneous qualitative identification of SARS-CoV-2 (formerly 2019-nCoV), Influenza A, Influenza B, and RSV RNA. These etiologic agents are generally detectable in nasopharyngeal and nasal swabs during the ACUTE PHASE of infection. Hence, this test is intended to be  performed on respiratory specimens collected from individuals with signs and symptoms of upper respiratory tract infection who meet Centers for Disease Control and Prevention (CDC) clinical and/or epidemiological criteria for Coronavirus Disease 2019 (COVID-19) testing. CDC COVID-19 criteria for testing on human specimens is available at [facility] webpage information for Healthcare Professionals: Coronavirus Disease 2019 (COVID-19) ([employer]).     False-negative results may occur if the virus has genomic mutations, insertions, deletions, or rearrangements or if performed very early in the course of illness. Otherwise, negative results indicate virus specific RNA targets are not detected,  however negative results do not preclude SARS-CoV-2 infection/COVID-19, Influenza, or Respiratory syncytial virus infection. Results should not be used as the sole basis for patient management decisions. Negative results must be combined with clinical observations, patient history, and epidemiological information. If upper respiratory tract infection is still suspected based on exposure history together with other clinical findings, re-testing should be considered.    Test methodology:   Cepheid Xpert Xpress SARS-CoV-2/Flu/RSV Assay real-time polymerase chain reaction (RT-PCR) test on the GeneXpert Dx and Xpert Xpress systems.       No orders to display       Medical Decision Making          Medical Decision Making  Risk  OTC drugs.  Prescription drug management.          Studies Assessed:  Lab work        MDM Narrative:    Medical Decision Making  A 73 year old female with a history of hypertension and chronic incontinence presented with two days of worsening sore throat, dry cough, headache, body aches, and nausea without vomiting. She reported chills, but no fever was documented due to lack of thermometer. Examination revealed a red throat without exudate, clear lungs, and no evidence of ear or nasal infection. She had recent exposure to a COVID-positive contact, but COVID, flu, and RSV tests were negative.    Acute Viral Upper Respiratory Infection with Associated Nausea  - Administered dexamethasone  for symptom relief.  - Prescribed Zofran  for nausea management.  - Advised use of acetaminophen  and ibuprofen for pain and fever control.  - Advised supportive care including hydration, nasal rinses, and electrolyte solutions as tolerated.  - Provided return precautions if symptoms worsen after day six or seven.  - Follow up with PCP in the next 1-2 days to recheck symptoms.  - Discharged home.       ED Course as of 01/14/24 1946   Wed Jan 14, 2024   1717 COVID-19, RSV, influenza testing was negative          Medications Ordered/Administered in the ED   dexAMETHasone  4 mg/mL injection (4 mg IntraMUSCULAR Given 01/14/24 1751)   acetaminophen  (TYLENOL ) tablet (650 mg Oral Given 01/14/24 1751)       Following the history, physical exam, and ED workup, the patient was deemed stable and suitable for discharge. The patient/caregiver was advised to return to the ED for any new or worsening symptoms. Discharge medications, and follow-up instructions were discussed with the patient/caregiver in detail, who verbalizes understanding. The patient/caregiver is in agreement and is comfortable with the plan of care.    Disposition: Discharged         Current Discharge Medication List        START taking these medications.        Details   ondansetron  4 mg Tablet, Rapid Dissolve  Commonly known as: ZOFRAN  ODT   4 mg, Oral, EVERY 8 HOURS PRN  Qty: 12 Tablet  Refills: 0            CONTINUE these medications - NO CHANGES were made during your visit.        Details   aspirin  81 mg Tablet, Chewable   81 mg, Daily  Refills: 0     Cholecalciferol (Vitamin D3) 50 mcg (2,000 unit) Capsule   1 Capsule, Oral, Daily  Refills: 0     gabapentin  100 mg Capsule  Commonly known as: NEURONTIN    100 mg, Oral, 3 TIMES DAILY PRN  Qty: 20 Capsule  Refills: 0     isosorbide  mononitrate 60 mg Tablet Sustained Release 24 hr  Commonly known as: IMDUR    60 mg, Oral, EVERY MORNING  Refills: 0     metoprolol  succinate 25 mg Tablet Sustained Release 24 hr  Commonly known as: TOPROL -XL   25 mg, Oral, Daily  Refills: 0     omeprazole 20 mg Capsule, Delayed Release(E.C.)  Commonly known as: PRILOSEC   20 mg, Oral, Daily  Refills: 0     rOPINIRole  1 mg Tablet  Commonly known as: REQUIP    2 mg, Oral, 2 TIMES DAILY  Refills: 0     traZODone  50 mg Tablet  Commonly known as: DESYREL    50 mg, Oral, NIGHTLY  Refills: 0            Follow up:   Lennie Eleanor SAUNDERS, FNP  [address]  [address]  [place] [address]  [phone]    In 1 day      [facility] - Emergency Department  [address]  [place]  [address]  [phone]    As needed, If symptoms worsen               Clinical Impression   Acute viral syndrome (Primary)         Discharge Medication List as of 01/14/2024  5:42 PM        START taking these medications    Details   ondansetron  (ZOFRAN  ODT) 4 mg Oral Tablet, Rapid Dissolve Take 1 Tablet (4 mg total) by mouth Every 8 hours as needed for Nausea/Vomiting, Disp-12 Tablet, R-0, E-Rx               Megan Cork, DO

## 2024-01-14 NOTE — ED Nurses Note (Signed)
 Pt assesed and treated in ED waiting. Discharge papers/instructions given and explained to pt. Pt assisted outside via w/c by this nurse.

## 2024-01-14 NOTE — ED Triage Notes (Signed)
 [facility] - Emergency Department   Physician/APP in Triage Note  Medical Screening Exam     Date and Time of Assessment: 01/14/2024 16:19     Chief Complaint   Patient presents with    Shortness of Breath       Brief HPI: Shortness of breath and headache for 2-3 days. Congestion and eye pain. Reports contact with child with Covid. Reports symptoms feel the same. Reports nausea without vomiting.     Focused Physical Exam:   ED Triage Vitals [01/14/24 1620]   BP (Non-Invasive) 123/88   Heart Rate 69   Respiratory Rate 20   Temperature 36.5 C (97.7 F)   SpO2 95 %   Weight 96.2 kg (212 lb)   Height 1.676 m (5' 6)     Gen: Nontoxic in appearance.  Head:  Atraumatic  Skin: Warm, dry.  Psych: Patient does have capacity.     Assessment: Medical Screening Exam.     Plan/MDM at Triage:  I have considered labs, imaging, EKG.  These have been ordered as clinically indicated.  I have considered that the patient may need medications.  However, due to treatment space limitation, medications considered have to be appropriate for the waiting room.    I have considered the patient's chronic conditions and have counseled the patient that we will be evaluating for the presence of an emergency medical condition today in which their chronic conditions may or may not play a role.  I have advised that the patient should notify their PCP of today's visit regardless of disposition.  I have considered the patient's social determinants of health and health care literacy while determining the patient's initial plan of care.  I have further advised that the patient will possibly see a 2nd emergency provider today during their stay to help promote throughput.     I have performed an initial medical screening evaluation in order to determine if the patient has an emergency medical condition. The patient has been evaluated.  Imminent life, limb, or sight threatening emergency will be taken to the treatment area immediately. The  patient's orders will be reviewed by nursing staff and placed in the treatment area as soon as possible as bed capacity allows.  Should the patient's status change, or a new sign/symptom develop, immediate notification of nursing or ED staff has been advised.         Preliminary Plan:  Labs ordered  Patient will return to waiting room

## 2024-01-26 ENCOUNTER — Ambulatory Visit

## 2024-03-03 ENCOUNTER — Other Ambulatory Visit: Payer: Self-pay

## 2024-03-03 ENCOUNTER — Ambulatory Visit
Admission: RE | Admit: 2024-03-03 | Discharge: 2024-03-03 | Disposition: A | Discharge status: Home / Self Care | Source: Ambulatory Visit

## 2024-03-03 DIAGNOSIS — K760 Fatty (change of) liver, not elsewhere classified: Secondary | ICD-10-CM

## 2024-03-03 DIAGNOSIS — R112 Nausea with vomiting, unspecified: Secondary | ICD-10-CM

## 2024-03-03 DIAGNOSIS — M549 Dorsalgia, unspecified: Secondary | ICD-10-CM

## 2024-05-23 ENCOUNTER — Emergency Department (HOSPITAL_COMMUNITY)

## 2024-05-23 ENCOUNTER — Emergency Department
Admission: EM | Admit: 2024-05-23 | Discharge: 2024-05-23 | Disposition: A | Discharge status: Home / Self Care | Source: Skilled Nursing Facility | Attending: NURSE PRACTITIONER | Admitting: NURSE PRACTITIONER

## 2024-05-23 ENCOUNTER — Other Ambulatory Visit: Payer: Self-pay

## 2024-05-23 DIAGNOSIS — J441 Chronic obstructive pulmonary disease with (acute) exacerbation: Secondary | ICD-10-CM | POA: Insufficient documentation

## 2024-05-23 DIAGNOSIS — R0602 Shortness of breath: Secondary | ICD-10-CM

## 2024-05-23 DIAGNOSIS — M7989 Other specified soft tissue disorders: Secondary | ICD-10-CM | POA: Insufficient documentation

## 2024-05-23 DIAGNOSIS — M79605 Pain in left leg: Secondary | ICD-10-CM | POA: Insufficient documentation

## 2024-05-23 LAB — CBC WITH DIFF
BASOPHIL #: 0.1 x10ˆ3/uL (ref 0.00–0.10)
BASOPHIL %: 1 % (ref 0–1)
EOSINOPHIL #: 0.2 x10ˆ3/uL (ref 0.00–0.50)
EOSINOPHIL %: 4 % (ref 1–7)
HCT: 36.2 % (ref 31.2–41.9)
HGB: 12.8 g/dL (ref 10.9–14.3)
LYMPHOCYTE #: 2.2 x10ˆ3/uL (ref 1.10–3.10)
LYMPHOCYTE %: 37 % (ref 16–46)
MCH: 33.3 pg — ABNORMAL HIGH (ref 24.7–32.8)
MCHC: 35.4 g/dL (ref 32.3–35.6)
MCV: 94.1 fL (ref 75.5–95.3)
MONOCYTE #: 0.5 x10ˆ3/uL (ref 0.20–0.90)
MONOCYTE %: 9 % (ref 4–11)
MPV: 7.5 fL — ABNORMAL LOW (ref 7.9–10.8)
NEUTROPHIL #: 2.9 x10ˆ3/uL (ref 1.90–8.20)
NEUTROPHIL %: 50 % (ref 43–77)
PLATELETS: 210 x10ˆ3/uL (ref 140–440)
RBC: 3.84 x10ˆ6/uL (ref 3.63–4.92)
RDW: 14.4 % (ref 12.3–17.7)
WBC: 5.9 x10ˆ3/uL (ref 3.8–11.8)

## 2024-05-23 LAB — COMPREHENSIVE METABOLIC PANEL, NON-FASTING
ALBUMIN/GLOBULIN RATIO: 1.7 — ABNORMAL HIGH (ref 0.8–1.4)
ALBUMIN: 4.3 g/dL (ref 3.5–5.7)
ALKALINE PHOSPHATASE: 68 U/L (ref 34–104)
ALT (SGPT): 16 U/L (ref 7–52)
ANION GAP: 11 mmol/L (ref 4–13)
AST (SGOT): 18 U/L (ref 13–39)
BILIRUBIN TOTAL: 0.4 mg/dL (ref 0.3–1.0)
BUN/CREA RATIO: 17 (ref 6–22)
BUN: 18 mg/dL (ref 7–25)
CALCIUM, CORRECTED: 8.9 mg/dL (ref 8.9–10.8)
CALCIUM: 9.1 mg/dL (ref 8.6–10.3)
CHLORIDE: 105 mmol/L (ref 98–107)
CO2 TOTAL: 23 mmol/L (ref 21–31)
CREATININE: 1.04 mg/dL (ref 0.60–1.30)
ESTIMATED GFR: 57 mL/min/1.73mˆ2 — ABNORMAL LOW (ref >59)
GLOBULIN: 2.6 (ref 2.0–3.5)
GLUCOSE: 116 mg/dL — ABNORMAL HIGH (ref 74–109)
OSMOLALITY, CALCULATED: 281 mosm/kg (ref 270–290)
POTASSIUM: 3.6 mmol/L (ref 3.5–5.1)
PROTEIN TOTAL: 6.9 g/dL (ref 6.4–8.9)
SODIUM: 139 mmol/L (ref 136–145)

## 2024-05-23 LAB — PTT (PARTIAL THROMBOPLASTIN TIME): APTT: 28.9 s (ref 25.0–38.0)

## 2024-05-23 LAB — TROPONIN-I
TROPONIN I: 5 ng/L (ref <15)
TROPONIN I: 5 ng/L (ref <15)
TROPONIN I: 6 ng/L (ref <15)

## 2024-05-23 LAB — B-TYPE NATRIURETIC PEPTIDE (BNP),PLASMA: BNP: 125 pg/mL — ABNORMAL HIGH (ref 1–100)

## 2024-05-23 LAB — COVID-19, FLU A/B, RSV RAPID BY PCR
INFLUENZA VIRUS TYPE A: NOT DETECTED
INFLUENZA VIRUS TYPE B: NOT DETECTED
RESPIRATORY SYNCYTIAL VIRUS (RSV): NOT DETECTED
SARS-CoV-2: NOT DETECTED

## 2024-05-23 LAB — PT/INR
INR: 0.98 (ref 0.84–1.10)
PROTHROMBIN TIME: 11.1 s (ref 9.8–12.7)

## 2024-05-23 MED ORDER — ASPIRIN 81 MG CHEWABLE TABLET
324.0000 mg | CHEWABLE_TABLET | ORAL | Status: AC
  Administered 2024-05-23: 324 mg via ORAL

## 2024-05-23 MED ORDER — IPRATROPIUM 0.5 MG-ALBUTEROL 3 MG (2.5 MG BASE)/3 ML NEBULIZATION SOLN
3.0000 mL | INHALATION_SOLUTION | RESPIRATORY_TRACT | Status: AC
  Administered 2024-05-23: 3 mL via RESPIRATORY_TRACT

## 2024-05-23 MED ORDER — IOPAMIDOL 370 MG IODINE/ML (76 %) INTRAVENOUS SOLUTION
75.0000 mL | INTRAVENOUS | Status: AC
  Administered 2024-05-23: 75 mL via INTRAVENOUS

## 2024-05-23 MED ORDER — PREDNISONE 20 MG TABLET: 20.0000 mg | ORAL_TABLET | Freq: Every day | ORAL | 0 refills | Status: AC

## 2024-05-23 MED ORDER — ASPIRIN 81 MG CHEWABLE TABLET
CHEWABLE_TABLET | ORAL | Status: AC
  Filled 2024-05-23: qty 4

## 2024-05-23 MED ORDER — ACETAMINOPHEN 325 MG TABLET
975.0000 mg | ORAL_TABLET | ORAL | Status: AC
  Administered 2024-05-23: 975 mg via ORAL

## 2024-05-23 MED ORDER — METHYLPREDNISOLONE SOD SUCC 125 MG SOLUTION FOR INJECTION WRAPPER
125.0000 mg | INTRAVENOUS | Status: AC
  Administered 2024-05-23: 125 mg via INTRAVENOUS

## 2024-05-23 MED ORDER — ACETAMINOPHEN 325 MG TABLET
ORAL_TABLET | ORAL | Status: AC
  Filled 2024-05-23: qty 3

## 2024-05-23 MED ORDER — METHYLPREDNISOLONE SOD SUCC 125 MG SOLUTION FOR INJECTION WRAPPER
INTRAVENOUS | Status: AC
  Filled 2024-05-23: qty 2

## 2024-05-23 MED ORDER — ALBUTEROL SULFATE HFA 90 MCG/ACTUATION AEROSOL INHALER: 1.0000 | INHALATION_SPRAY | Freq: Four times a day (QID) | RESPIRATORY_TRACT | 0 refills | Status: AC | PRN

## 2024-05-23 NOTE — Discharge Instructions (Addendum)
 Discharge Instructions: Recovering from a COPD Flare-up (Exacerbation)  A COPD exacerbation (flare-up) occurs when your usual symptoms--shortness of breath, cough, and mucus production--suddenly get much worse. Now that you are leaving the hospital, your focus is on healing your lungs and preventing another flare-up.  1. When to Seek Immediate Help  Knowing when your breathing is becoming dangerous is the most important part of your recovery.  Call 911 or go to the Emergency Room if:  You have severe shortness of breath that does not improve with your rescue inhaler.  You are unable to speak in full sentences because you are gasping for air.  Your lips or fingernails look blue, gray, or purple.  You feel confused, very sleepy, or dizzy.  You have new or worsening chest pain.  Call your Doctor right away if:  You are coughing up more mucus than usual, or the color changes (e.g., becomes yellow, green, or bloody).  You have a fever or chills.  You notice new swelling in your ankles or legs.  Your rescue medicine isn't lasting as long as it used to.  2. Your Medications  Your doctor likely adjusted your medications. It is critical to take them exactly as prescribed, even if you start to feel better.  Inhalers  Maintenance (Controller) Inhalers: Take these every day at the same time to keep your airways open long-term. Do not skip doses.  Rescue (Quick-Relief) Inhalers: Use these only when you feel short of breath or as directed before activity.  Spacer Use: If you use a Metered Dose Inhaler (MDI), always use a spacer. This helps the medicine reach deep into your lungs instead of just hitting the back of your throat.  Oral Medications  Steroids (e.g., Prednisone ): These reduce lung inflammation. If you were given a taper (reducing the dose daily), follow the schedule exactly. Do not stop suddenly.  Antibiotics: If prescribed, finish the entire bottle, even if your symptoms disappear after two days.  3. Breathing  Techniques & Clearing Mucus  Practice these daily to help your lungs work more efficiently.  Pursed-Lip Breathing: Breathe in through your nose for 2 seconds. Pucker your lips (as if to whistle or blow out a candle) and breathe out slowly for 4 seconds. This keeps your airways open longer.  Controlled Coughing: Sit upright. Take a deep breath, hold it for a few seconds, and then perform two sharp, short coughs to help move mucus out.  Stay Hydrated: Unless your doctor told you otherwise, drink plenty of water. This keeps mucus thin and easier to cough up.  4. Energy Conservation  Your body needs energy to breathe and heal.  Eat Small Meals: Eat 5-6 small meals a day instead of 3 large ones. A full stomach can push against your diaphragm and make it harder to breathe.  Simplify Tasks: Sit down while brushing your teeth, grooming, or cooking. Place frequently used items at waist level to avoid bending or reaching.  Pace Yourself: It is normal to feel tired. Rest between activities and do not try to push through severe shortness of breath.  5. Preventing Future Flare-ups  Stop Smoking: This is the single most important step for your health. Ask your doctor about patches, gum, or programs to help you quit.  Avoid Triggers: Stay away from secondhand smoke, strong perfumes, chemical fumes, and air pollution. On very cold or humid days, stay indoors or cover your mouth with a scarf.  Stay Up-to-Date on Vaccines: Get your annual flu shot. Ask  your doctor if you are due for the pneumonia, COVID-19, or RSV vaccines.  Infection Control: Wash your hands often and avoid large crowds during flu season.  6. Follow-Up Care  Recovery doesn't end at the hospital door.  Doctor Appointment: See your primary care doctor or pulmonologist within 7 to 14 days. Bring your hospital discharge papers and all your medications (in their bottles) to this visit.  Pulmonary Rehabilitation: Ask your doctor for a referral to Pulmonary Rehab. This  is a specialized exercise and education program that significantly reduces the chance of ending up back in the hospital.

## 2024-05-23 NOTE — ED Triage Notes (Signed)
 Sob started last night worse today . Chest pain started this am

## 2024-05-23 NOTE — ED Nurses Note (Signed)
 Patient discharged home with family.  Patient alert and oriented x4. AVS reviewed with patient/care giver.  A written copy of the AVS and discharge instructions was given to the patient/care giver.  Questions sufficiently answered as needed.  Patient/care giver encouraged to follow up with PCP as indicated.  In the event of an emergency, patient/care giver instructed to call 911 or go to the nearest emergency room. Patient's iv removed at this time, bleeding controlled and pressure dressing applied. Patient leaves Emergency Department at this time ambulatory with a steady gait.

## 2024-05-23 NOTE — ED Provider Notes (Signed)
 [facility] - Emergency Department  ED Primary Note  History of Present Illness   Judy Hicks is a 74 y.o. female who had concerns including Shortness of Breath. ***    Past Medical History:   Past Medical History:   Diagnosis Date    CAD (coronary artery disease) 09/24/2021    Depression 09/24/2021    Essential hypertension 09/24/2021    GERD (gastroesophageal reflux disease)     History of MI (myocardial infarction) 09/24/2021    HTN (hypertension)     Hyperlipidemia 09/24/2021    Lupus 09/24/2021    Lyme disease     Narcolepsy     Neuropathy (CMS HCC) 09/24/2021    Restless legs syndrome     Sleep apnea 09/24/2021    Thyroid  disease 09/24/2021    Urinary incontinence 09/24/2021       Past Surgical History:   Past Surgical History:   Procedure Laterality Date    Cardiac catheterization      Cataract extraction, bilateral      Hx appendectomy      Hx breast biopsy Right     Hx bunionectomy Left     Hx hip replacement Bilateral     Hx hysterectomy         Social History: Social History[1]     Family History:  Family History   Problem Relation Age of Onset    Colon Cancer Mother     Liver Cancer Mother     Esophageal cancer Father     Diabetes Other     Hypertension (High Blood Pressure) Other      Oncology History    No problem history exists.        Physical Exam   ED Triage Vitals [05/23/24 1744]   BP (Non-Invasive) (!) 148/99   Heart Rate 72   Respiratory Rate 20   Temperature 36.3 C (97.4 F)   SpO2 100 %   Weight 96.2 kg (212 lb)   Height 1.707 m (5' 7.2)     {ED Physical Exam:54666}      Evaro Pain Rating Scale     On a scale of 0-10, during the past 24 hours, pain has interfered with you usual activity:       On a scale of 0-10, during the past 24 hours, pain has interfered with your sleep:      On a scale of 0-10, during the past 24 hours, pain has affected your mood:       On a scale of 0-10, during the past 24 hours, pain has contributed to your stress:       On a scale of 0-10, what  is your overall pain Rating: 6        Medications Prior to Admission       Prescriptions    aspirin  81 mg Oral Tablet, Chewable    1 Tablet (81 mg total) Once a day    Cholecalciferol, Vitamin D3, 50 mcg (2,000 unit) Oral Capsule    Take 1 Capsule by mouth Once a day    gabapentin  (NEURONTIN ) 100 mg Oral Capsule    Take 1 Capsule (100 mg total) by mouth Three times a day as needed    isosorbide  mononitrate (IMDUR ) 60 mg Oral Tablet Sustained Release 24 hr    Take 1 Tablet (60 mg total) by mouth Every morning    metoprolol  succinate (TOPROL -XL) 25 mg Oral Tablet Sustained Release 24 hr    Take 1  Tablet (25 mg total) by mouth Once a day    omeprazole (PRILOSEC) 20 mg Oral Capsule, Delayed Release(E.C.)    Take 1 Capsule (20 mg total) by mouth Once a day    ondansetron  (ZOFRAN  ODT) 4 mg Oral Tablet, Rapid Dissolve    Take 1 Tablet (4 mg total) by mouth Every 8 hours as needed for Nausea/Vomiting    rOPINIRole  (REQUIP ) 1 mg Oral Tablet    Take 2 Tablets (2 mg total) by mouth Twice daily    traZODone  (DESYREL ) 50 mg Oral Tablet    Take 1 Tablet (50 mg total) by mouth Every night            Allergies: Allergies[2]    Above history reviewed with patient.  Allergies, medication list, reviewed.     Patient Data   Labs Ordered/Reviewed   COMPREHENSIVE METABOLIC PANEL, NON-FASTING - Abnormal; Notable for the following components:       Result Value    ESTIMATED GFR 57 (*)     GLUCOSE 116 (*)     ALBUMIN/GLOBULIN RATIO 1.7 (*)     All other components within normal limits    Narrative:     Estimated Glomerular Filtration Rate (eGFR) is calculated using the CKD-EPI (2021) equation, intended for patients 22 years of age and older. If gender is not documented or unknown, there will be no eGFR calculation.     CBC WITH DIFF - Abnormal; Notable for the following components:    MCH 33.3 (*)     MPV 7.5 (*)     All other components within normal limits   B-TYPE NATRIURETIC PEPTIDE (BNP),PLASMA - Abnormal; Notable for the following  components:    BNP 125 (*)     All other components within normal limits    Narrative:                                 Class 1: 101-250 pg/mL                              Class 2: 251-550 pg/mL                              Class 3: 551-900 pg/mL                              Class 4: >901 pg/mL     The [place]  [employer] has developed a four-stage functional classification system for CHF that is based on a subjective interpretation of the severity of a patient's clinical signs and symptoms.    Class 1 - Patients have no limitations on physical activity and have no symptoms with ordinary physical activity.    Class 2 - Patients have a slight limitation of physical activity and have symptoms with ordinary physical activity.    Class 3 - Patients have a marked limitation of physical activity and have symptoms with less than ordinary physical activity, but not at rest.    Class 4 - Patients are unable to perform any physical activity without discomfort.   PT/INR - Normal    Narrative:     In the setting of warfarin therapy, a moderate-intensity INR goal range is 2.0 to 3.0 and a high-intensity INR goal range is  2.5 to 3.5.    INR is ONLY validated to determine the level of anticoagulation with vitamin K antagonists (warfarin). Other factors may elevate the INR including but not limited to direct oral anticoagulants (DOACs), liver dysfunction, vitamin K deficiency, DIC, factor deficiencies, and factor inhibitors.   PTT (PARTIAL THROMBOPLASTIN TIME) - Normal   TROPONIN-I - Normal   TROPONIN-I - Normal   TROPONIN-I - Normal   COVID-19, FLU A/B, RSV RAPID BY PCR - Normal    Narrative:     Results are for the simultaneous qualitative identification of SARS-CoV-2 (formerly 2019-nCoV), Influenza A, Influenza B, and RSV RNA. These etiologic agents are generally detectable in nasopharyngeal and nasal swabs during the ACUTE PHASE of infection. Hence, this test is intended to be performed on respiratory specimens collected  from individuals with signs and symptoms of upper respiratory tract infection who meet Centers for Disease Control and Prevention (CDC) clinical and/or epidemiological criteria for Coronavirus Disease 2019 (COVID-19) testing. CDC COVID-19 criteria for testing on human specimens is available at [employer] webpage information for Healthcare Professionals: Coronavirus Disease 2019 (COVID-19) ([employer]).     False-negative results may occur if the virus has genomic mutations, insertions, deletions, or rearrangements or if performed very early in the course of illness. Otherwise, negative results indicate virus specific RNA targets are not detected, however negative results do not preclude SARS-CoV-2 infection/COVID-19, Influenza, or Respiratory syncytial virus infection. Results should not be used as the sole basis for patient management decisions. Negative results must be combined with clinical observations, patient history, and epidemiological information. If upper respiratory tract infection is still suspected based on exposure history together with other clinical findings, re-testing should be considered.    Test methodology:   Cepheid Xpert Xpress SARS-CoV-2/Flu/RSV Assay real-time polymerase chain reaction (RT-PCR) test on the GeneXpert Dx and Xpert Xpress systems.   CBC/DIFF    Narrative:     The following orders were created for panel order CBC/DIFF.  Procedure                               Abnormality         Status                     ---------                               -----------         ------                     CBC WITH DIFF[[id number]]                Abnormal            Final result                 Please view results for these tests on the individual orders.   EXTRA TUBES    Narrative:     The following orders were created for panel order EXTRA TUBES.  Procedure                               Abnormality         Status                     ---------                                -----------         ------  GOLD TOP ULAZ[[id number]]                                    In process                   Please view results for these tests on the individual orders.   GOLD TOP TUBE     CT ANGIO CHEST FOR PULMONARY EMBOLUS W IV CONTRAST   Final Result by Edi, Radresults In (01/18 2134)      No evidence of acute pulmonary embolism or other acute findings in the chest.      6 mm left lower lobe pulmonary nodule, unchanged.                     Radiologist location ID: [id number]         XR AP MOBILE CHEST   Final Result by Edi, Radresults In (01/18 1846)   No acute cardiopulmonary findings.            Radiologist location ID: [id number]           Medical Decision Making   {?? Select the MDM button at the top of the note composer window and be sure to fill out the MDM SmartBlock in Notewriter to the [facility]     Medical Decision Making  Amount and/or Complexity of Data Reviewed  Radiology: ordered.    Risk  Prescription drug management.                Medications Ordered/Administered in the ED   methylPREDNISolone  sod succ (SOLU-medrol ) 125 mg/2 mL injection (has no administration in time range)   acetaminophen  (TYLENOL ) tablet (has no administration in time range)   aspirin  chewable tablet 324 mg (324 mg Oral Given 05/23/24 1814)   iopamidol  (ISOVUE -370) 76% solution (75 mL Intravenous Given 05/23/24 2120)   ipratropium-albuterol  0.5 mg-3 mg(2.5 mg base)/3 mL Solution for Nebulization (3 mL Nebulization Given 05/23/24 2207)     Clinical Impression   Pain and swelling of lower extremity, left   Chronic obstructive pulmonary disease with (acute) exacerbation (CMS HCC) (Primary)       Disposition: Discharged  {?Quick Link  Level of Service Calculator:123}  {Critical Care Time (Optional):37527}     {?? Remember to refresh your note prior to signing. Use Control + F11 or click the refresh button at the bottom of the note:38154}       [1]   Social History  Tobacco Use     Smoking status: Never    Smokeless tobacco: Never   Vaping Use    Vaping status: Never Used   Substance Use Topics    Alcohol use: Never    Drug use: Never   [2]   Allergies  Allergen Reactions    Azithromycin Itching, Rash, Shortness of Breath and Swelling     Other reaction(s): Other (see comments), Sweating (intolerance)    Gemfibrozil Itching, Rash, Shortness of Breath and Swelling     Other reaction(s): Other (see comments), Other (See Comments), Other - See Comments  Joint pain and fatigue   Joint pain and fatigue   Joint pain and fatigue   Joint pain and fatigue       Lisinopril Itching, Rash, Shortness of Breath and Swelling     Other reaction(s): Other (see comments)    Penicillins Shortness of Breath    Statins-Hmg-Coa Reductase Inhibitors Shortness  of Breath    Levofloxacin Rash     Other reaction(s): Other (see comments)    Niacin Rash    Strawberry Itching and Rash    Ezetimibe Hives/ Urticaria     Other reaction(s): Other (see comments)

## 2024-05-23 NOTE — ED APP Handoff Note (Signed)
 [facility] - Emergency Department  Emergency Department  Provider in Triage Note    Name: Judy Hicks  Age: 74 y.o.  Gender: female     Subjective: Judy Hicks is a 74 y.o. female who presents to the ED today for Shortness of Breath. Pt reports SOB that worsened overnight. Chest pain that began early this morning and has been constant.       Additional History:       Objective:    Pertinent Exam findings:  Filed Vitals:    05/23/24 1744   BP: (!) 148/99   Pulse: 72   Resp: 20   Temp: 36.3 C (97.4 F)   SpO2: 100%       Gen: WNWD, Nontoxic in appearance.  Head: NC/AT  Neck: Trachea midline.   Skin: Warm, dry    Assessment:  A medical screening exam was completed.  This patient is a 74 y.o. female with initial findings showing SOB.     Plan/MDM at Triage:  I have considered labs, imaging, EKG.  These have been ordered as clinically indicated.  I have considered that the patient may need medications.  However, due to treatment space limitation, medications considered have to be appropriate for the waiting room.    I have considered the patient's chronic conditions and have counseled the patient that we will be evaluating for the presence of an emergency medical condition today in which their chronic conditions may or may not play a role.  I have advised that the patient should notify their PCP of today's visit regardless of disposition.  I have considered the patient's social determinants of health and health care literacy while determining the patient's initial plan of care.  I have further advised that the patient will possibly see a 2nd emergency provider today during their stay to help promote throughput.    I have performed an initial medical screening evaluation in order to determine if the patient has an emergency medical condition. The patient has been evaluated.  Imminent life, limb, or sight threatening emergency will be taken to the treatment area immediately. The patient's orders  will be reviewed by nursing staff and placed in the treatment area as soon as possible as bed capacity allows.  Should the patient's status change, or a new sign/symptom develop, immediate notification of nursing or ED staff has been advised.      Erwin Bars, PA-C     05/23/2024 17:43

## 2024-05-24 DIAGNOSIS — R079 Chest pain, unspecified: Secondary | ICD-10-CM

## 2024-05-24 LAB — ECG 12 LEAD
Atrial Rate: 69 {beats}/min
Calculated P Axis: 61 degrees
Calculated R Axis: 43 degrees
Calculated T Axis: 30 degrees
PR Interval: 144 ms
QRS Duration: 86 ms
QT Interval: 428 ms
QTC Calculation: 458 ms
Ventricular rate: 69 {beats}/min
